# Patient Record
Sex: Female | Born: 1975 | Race: White | Hispanic: No | Marital: Married | Smoking: Current every day smoker
Health system: Southern US, Community
[De-identification: ages and names within clinical notes are randomized; demographics above are authoritative.]

## PROBLEM LIST (undated history)

## (undated) DIAGNOSIS — I10 Essential (primary) hypertension: Secondary | ICD-10-CM

## (undated) DIAGNOSIS — K6282 Dysplasia of anus: Secondary | ICD-10-CM

## (undated) DIAGNOSIS — R011 Cardiac murmur, unspecified: Secondary | ICD-10-CM

## (undated) DIAGNOSIS — E785 Hyperlipidemia, unspecified: Secondary | ICD-10-CM

## (undated) DIAGNOSIS — K219 Gastro-esophageal reflux disease without esophagitis: Secondary | ICD-10-CM

## (undated) DIAGNOSIS — G43909 Migraine, unspecified, not intractable, without status migrainosus: Secondary | ICD-10-CM

## (undated) HISTORY — DX: Hyperlipidemia, unspecified: E78.5

## (undated) HISTORY — DX: Gastro-esophageal reflux disease without esophagitis: K21.9

## (undated) HISTORY — DX: Cardiac murmur, unspecified: R01.1

## (undated) HISTORY — DX: Migraine, unspecified, not intractable, without status migrainosus: G43.909

## (undated) HISTORY — DX: Essential (primary) hypertension: I10

---

## 1997-07-21 ENCOUNTER — Other Ambulatory Visit: Admission: RE | Admit: 1997-07-21 | Discharge: 1997-07-21 | Payer: Self-pay | Admitting: Obstetrics and Gynecology

## 1998-03-24 ENCOUNTER — Other Ambulatory Visit: Admission: RE | Admit: 1998-03-24 | Discharge: 1998-03-24 | Payer: Self-pay | Admitting: Obstetrics and Gynecology

## 1999-02-09 ENCOUNTER — Other Ambulatory Visit: Admission: RE | Admit: 1999-02-09 | Discharge: 1999-02-09 | Payer: Self-pay | Admitting: Obstetrics and Gynecology

## 1999-06-28 ENCOUNTER — Inpatient Hospital Stay (HOSPITAL_COMMUNITY): Admission: AD | Admit: 1999-06-28 | Discharge: 1999-06-28 | Payer: Self-pay | Admitting: *Deleted

## 1999-07-30 ENCOUNTER — Inpatient Hospital Stay (HOSPITAL_COMMUNITY): Admission: AD | Admit: 1999-07-30 | Discharge: 1999-07-30 | Payer: Self-pay | Admitting: Obstetrics and Gynecology

## 1999-08-04 ENCOUNTER — Other Ambulatory Visit: Admission: RE | Admit: 1999-08-04 | Discharge: 1999-08-04 | Payer: Self-pay | Admitting: Obstetrics and Gynecology

## 1999-08-12 ENCOUNTER — Inpatient Hospital Stay (HOSPITAL_COMMUNITY): Admission: AD | Admit: 1999-08-12 | Discharge: 1999-08-15 | Payer: Self-pay | Admitting: Obstetrics and Gynecology

## 1999-08-12 ENCOUNTER — Encounter (INDEPENDENT_AMBULATORY_CARE_PROVIDER_SITE_OTHER): Payer: Self-pay | Admitting: Specialist

## 1999-08-16 ENCOUNTER — Encounter: Admission: RE | Admit: 1999-08-16 | Discharge: 1999-11-14 | Payer: Self-pay | Admitting: *Deleted

## 1999-08-18 ENCOUNTER — Inpatient Hospital Stay (HOSPITAL_COMMUNITY): Admission: AD | Admit: 1999-08-18 | Discharge: 1999-08-20 | Payer: Self-pay | Admitting: Obstetrics and Gynecology

## 1999-09-27 ENCOUNTER — Other Ambulatory Visit: Admission: RE | Admit: 1999-09-27 | Discharge: 1999-09-27 | Payer: Self-pay | Admitting: Obstetrics and Gynecology

## 2000-11-15 ENCOUNTER — Other Ambulatory Visit: Admission: RE | Admit: 2000-11-15 | Discharge: 2000-11-15 | Payer: Self-pay | Admitting: Obstetrics and Gynecology

## 2001-12-25 ENCOUNTER — Other Ambulatory Visit: Admission: RE | Admit: 2001-12-25 | Discharge: 2001-12-25 | Payer: Self-pay | Admitting: Obstetrics and Gynecology

## 2002-10-22 ENCOUNTER — Encounter: Payer: Self-pay | Admitting: Obstetrics and Gynecology

## 2002-10-22 ENCOUNTER — Inpatient Hospital Stay (HOSPITAL_COMMUNITY): Admission: RE | Admit: 2002-10-22 | Discharge: 2002-10-22 | Payer: Self-pay | Admitting: Obstetrics and Gynecology

## 2002-11-19 ENCOUNTER — Other Ambulatory Visit: Admission: RE | Admit: 2002-11-19 | Discharge: 2002-11-19 | Payer: Self-pay | Admitting: Obstetrics and Gynecology

## 2002-11-20 ENCOUNTER — Other Ambulatory Visit: Admission: RE | Admit: 2002-11-20 | Discharge: 2002-11-20 | Payer: Self-pay | Admitting: Obstetrics and Gynecology

## 2003-03-22 HISTORY — PX: CHOLECYSTECTOMY: SHX55

## 2003-04-22 ENCOUNTER — Inpatient Hospital Stay (HOSPITAL_COMMUNITY): Admission: AD | Admit: 2003-04-22 | Discharge: 2003-04-22 | Payer: Self-pay | Admitting: *Deleted

## 2003-06-03 ENCOUNTER — Inpatient Hospital Stay (HOSPITAL_COMMUNITY): Admission: AD | Admit: 2003-06-03 | Discharge: 2003-06-05 | Payer: Self-pay | Admitting: Obstetrics & Gynecology

## 2003-06-09 ENCOUNTER — Inpatient Hospital Stay (HOSPITAL_COMMUNITY): Admission: AD | Admit: 2003-06-09 | Discharge: 2003-06-11 | Payer: Self-pay | Admitting: Obstetrics and Gynecology

## 2003-06-15 ENCOUNTER — Inpatient Hospital Stay (HOSPITAL_COMMUNITY): Admission: AD | Admit: 2003-06-15 | Discharge: 2003-06-15 | Payer: Self-pay | Admitting: Obstetrics and Gynecology

## 2003-07-16 ENCOUNTER — Other Ambulatory Visit: Admission: RE | Admit: 2003-07-16 | Discharge: 2003-07-16 | Payer: Self-pay | Admitting: Obstetrics and Gynecology

## 2004-02-27 ENCOUNTER — Ambulatory Visit (HOSPITAL_COMMUNITY): Admission: RE | Admit: 2004-02-27 | Discharge: 2004-02-27 | Payer: Self-pay | Admitting: Obstetrics and Gynecology

## 2004-02-27 ENCOUNTER — Encounter (INDEPENDENT_AMBULATORY_CARE_PROVIDER_SITE_OTHER): Payer: Self-pay | Admitting: Specialist

## 2004-02-27 HISTORY — PX: TUBAL LIGATION: SHX77

## 2004-07-28 ENCOUNTER — Other Ambulatory Visit: Admission: RE | Admit: 2004-07-28 | Discharge: 2004-07-28 | Payer: Self-pay | Admitting: Obstetrics and Gynecology

## 2008-08-20 HISTORY — PX: LAPAROSCOPIC ASSISTED VAGINAL HYSTERECTOMY: SHX5398

## 2008-08-21 ENCOUNTER — Encounter (INDEPENDENT_AMBULATORY_CARE_PROVIDER_SITE_OTHER): Payer: Self-pay | Admitting: Obstetrics and Gynecology

## 2008-08-21 ENCOUNTER — Ambulatory Visit (HOSPITAL_BASED_OUTPATIENT_CLINIC_OR_DEPARTMENT_OTHER): Admission: RE | Admit: 2008-08-21 | Discharge: 2008-08-21 | Payer: Self-pay | Admitting: Urology

## 2010-06-28 LAB — BASIC METABOLIC PANEL
Calcium: 9.2 mg/dL (ref 8.4–10.5)
Chloride: 105 mEq/L (ref 96–112)
GFR calc Af Amer: >60 mL/min (ref >60)
Glucose, Bld: 96 mg/dL (ref 70–99)
Sodium: 140 mEq/L (ref 135–145)

## 2010-06-28 LAB — CBC
HCT: 43.2 % (ref 36.0–46.0)
Hemoglobin: 14.8 g/dL (ref 12.0–15.0)
MCHC: 34.2 g/dL (ref 30.0–36.0)
MCV: 90.8 fL (ref 78.0–100.0)
RBC: 4.75 MIL/uL (ref 3.87–5.11)
RDW: 12.9 % (ref 11.5–15.5)
WBC: 5.7 10*3/uL (ref 4.0–10.5)

## 2010-08-03 NOTE — Op Note (Signed)
Tina Freeman, Tina Freeman                 ACCOUNT NO.:  [id number]   MEDICAL RECORD NO.:  [id number]          PATIENT TYPE:  AMB   LOCATION:  [facility]                         FACILITY:  [facility]   PHYSICIAN:  Sigmund I. Patsi Sears, M.D.DATE OF BIRTH:  06/13/1975   DATE OF PROCEDURE:  08/21/2008  DATE OF DISCHARGE:                               OPERATIVE REPORT   PREOPERATIVE DIAGNOSIS:  Urinary stress incontinence.   POSTOPERATIVE DIAGNOSIS:  Urinary stress incontinence.   OPERATION:  [employer] retropubic pubovaginal sling.   SURGEON:  Sigmund I. Patsi Sears, M.D.   ANESTHESIA:  General endotracheal.   PREPARATION:  After appropriate preanesthesia, the patient is brought to  the operating room, placed on the operating room in the supine position  where general endotracheal anesthesia was introduced.  She then was  replaced in position for laparoscopic salpingo-oophorectomy per Marcelino Duster  L. Vincente Poli, M.D.  This procedure is dictated separately.   BRIEF HISTORY:  This 35 year old female was noted to be para 2-2-0, with  urinary incontinence beginning 6 months ago.  She complains of cough,  laugh, sneeze, and jumping leakage.  No urge incontinence.  She is post  hysterectomy 2008 for heavy bleeding.  She weighs 223 pounds, is  frustrated because she is unable to exercise.  After consideration of  all options for her, we have selected the [place] retropubic sling, because  of her athletic activity and young age.   PROCEDURE:  Vaginal inspection reveals a small cystocele, no enterocele  or rectocele was noted.  The Foley catheter was placed within the  bladder.  10 mL of Marcaine 0.25% with epinephrine 1:200,000 was  injected in the periurethral space.  A 2 cm midline urethral incision  was made.  Subcutaneous tissue dissected on either side of the urethra  to the level of the retropubic fascia.  Two incisions were then made  above the pubic tubercle, and the [place] needles were placed.  It  is felt  because of the patient's large amount of prepubic fat, that the original  [place] passage was not in the right position.  Therefore the [place] needles  were removed, and replaced to be sure there behind the pubic bone.  After proper placement of the [place] needles, cystoscopy accomplished,  which showed no evidence of any bladder injury or urethral injury.  Following this, the [place] was placed on the needles, and the needles are  brought into the retropubic space, with right angle clamp in the midline  to prevent overtightening the sling.  The blue plastic protector was cut  at the midline, and removed.  With the right-angle present in the  midline, each sleeve was brought into the retropubic space.  The sleeves  were then cut subcutaneously.  There was mild to moderate bleeding prior  to placement of the sling, but no bleeding after the sling was placed.  The wound was irrigated with  saline solution.  The wound was closed with running 2-0 Vicryl suture.  The suprapubic wounds were closed with Dermabond.  The patient was  awakened and taken to recovery room in good  condition.  She had a B and  O suppository given.      Sigmund I. Patsi Sears, M.D.  Electronically Signed     SIT/MEDQ  D:  08/21/2008  T:  08/21/2008  Job:  [id number]   cc:   Marcelino Duster L. Vincente Poli, M.D.  Fax: [phone]   [facility], [place]

## 2010-08-03 NOTE — Op Note (Signed)
NAMESHANTERICA, Tina Freeman                 ACCOUNT NO.:  [id number]   MEDICAL RECORD NO.:  [id number]          PATIENT TYPE:  AMB   LOCATION:  [facility]                         FACILITY:  [facility]   PHYSICIAN:  Michelle L. Grewal, M.D.DATE OF BIRTH:  12/08/1975   DATE OF PROCEDURE:  08/20/2008  DATE OF DISCHARGE:                               OPERATIVE REPORT   PREOP DIAGNOSIS:  Pelvic pain and ovarian cysts.   POSTOP DIAGNOSIS:  Pelvic pain and ovarian cysts.   PROCEDURE:  Laparoscopic bilateral salpingo-oophorectomy and lysis of  adhesions.   SURGEON:  Dr. Vincente Poli.   ANESTHESIA:  General.   EBL:  Minimal.   PATHOLOGY:  Bilateral tubes and ovaries.   PROCEDURE:  Patient was taken to the operating room after informed  consent was obtained.  She was intubated in standard fashion.  She was  then prepped and draped and an in-and-out catheter was used to empty the  bladder.  A speculum was inserted into the vagina and the sponge stick  was inserted into the vagina.  Attention was turned to the abdomen.  A  small infraumbilical incision was made with the Veress needle.  Pneumoperitoneum was performed.  The 11-mm trocar was inserted.  The  laparoscope was introduced through the trocar sheath.  A 5-mm trocar was  inserted suprapubically under direct visualization and a third trocar  site was placed in the left lower quadrant using a 5-mm trocar that was  also inserted under direct visualization.  Exam of the pelvis revealed  the following:  A surgically absent uterus.  Her ovaries were inspected.  Her left ovary was adherent to the sidewall slightly and was enlarged  and had a hemorrhagic cyst within it.  The right ovary was unremarkable.  The grasper was used to elevate initially the right ovary.  I then  identified the ureter in its normal location and noted peristalsis on  the right side.  The EnSeal instrument was used and it was placed across  the IP ligament staying away from the ureter and  cautery was used to  transect and burn the IP ligament and it was carried down across the  mesosalpinx.  The entire ovary and tube was removed.  This was done in  identical fashion on the left side as well.  We did have to take some of  the adhesions down which was done with sharp and blunt dissection.  Careful attention was paid to avoid the ureter which was well away from  our surgical instruments.  After we released both tubes and ovaries, the  suprapubic trocar was changed to an 11-mm trocar.  An Endobag was  inserted and I scooped the ovaries and tubes into the Endobag and  removed the specimen.  Everything was sent to pathology.  The  pneumoperitoneum was released.  The trocars  were removed.  The larger trocar sites were closed with Vicryl  interrupted and the skin was closed with 3-0 Vicryl.  All sponge, lap,  and instrument counts were correct x2.  At this point, I scrubbed out.  Dr. Patsi Sears will scrub  in in that portion.  The remainder of the  surgery will be dictated by him.      Michelle L. Vincente Poli, M.D.  Electronically Signed     MLG/MEDQ  D:  08/22/2008  T:  08/22/2008  Job:  [id number]

## 2010-08-06 NOTE — H&P (Signed)
NAMEAMELIANNA, Tina Freeman                 ACCOUNT NO.:  [id number]   MEDICAL RECORD NO.:  [id number]          PATIENT TYPE:  AMB   LOCATION:  [facility]                           FACILITY:  [facility]   PHYSICIAN:  Guy Sandifer. Tomblin II, M.D.DATE OF BIRTH:  02-20-1976   DATE OF ADMISSION:  DATE OF DISCHARGE:                                HISTORY & PHYSICAL   DATE OF ADMISSION:  February 27, 2004   CHIEF COMPLAINT:  Desires permanent sterilization.   HISTORY OF PRESENT ILLNESS:  This patient is a 35 year old married white  female G2 P2 who desires permanent sterilization.  Alternate methods have  contraception have been discussed.  Permanence of the procedure as well as  potential risks and complications have been discussed preoperatively.   PAST MEDICAL HISTORY:  1.  Chronic hypertension.  2.  History of cystitis.  3.  Recent diagnosis of pneumonia treated as an outpatient with Biaxin, now      resolved.   PAST SURGICAL HISTORY:  1.  Cholecystectomy.  2.  LEEP.  3.  Colposcopy.   OBSTETRICAL HISTORY:  Vaginal delivery x2.   SOCIAL HISTORY:  Denies tobacco, alcohol, or drug abuse.   MEDICATIONS:  Toprol-XL daily.  Biaxin, Duricef, and prednisone started on  February 20, 2004.   ALLERGIES:  SULFA leading to rash followed by shortness of breath.   FAMILY HISTORY:  Coronary artery disease in paternal grandfather and  maternal grandfather.  Varicosities - father and mother, maternal  grandfather, maternal grandmother, paternal grandmother.  Kidney stones -  maternal grandfather and father.  Stroke - maternal grandmother.  Leukemia -  paternal grandmother.  Prostate cancer - maternal great-uncles x2.   REVIEW OF SYSTEMS:  NEURO:  Denies headache.  CARDIO:  Denies chest pain.  PULMONARY:  Denies shortness of breath.  Recent pneumonia as above.  GI:  Denies recent changes in bowel habits.   PHYSICAL EXAMINATION:  VITAL SIGNS:  Height 5 feet 7.5 inches, weight 191  pounds.  Blood pressure 132/82.  HEENT:  Without thyromegaly.  LUNGS:  Clear to auscultation.  HEART:  Regular rate and rhythm.  BACK:  Without CVA tenderness.  BREASTS:  Without mass, retraction, discharge.  ABDOMEN:  Soft, nontender, without masses.  PELVIC:  Vulva, vagina, and cervix without lesion.  Uterus normal size,  mobile, nontender.  Adnexa nontender without masses.  EXTREMITIES AND NEUROLOGIC:  Grossly within normal limits.   ASSESSMENT:  Multiparity, desires permanent sterilization.   PLAN:  Laparoscopy with Filshie clips.      JET/MEDQ  D:  02/23/2004  T:  02/23/2004  Job:  [id number]

## 2010-08-06 NOTE — Discharge Summary (Signed)
Tina Freeman, Tina Freeman                             ACCOUNT NO.:  [id number]   MEDICAL RECORD NO.:  [id number]                   PATIENT TYPE:  INP   LOCATION:  9374                                 FACILITY:  [facility]   PHYSICIAN:  Michelle L. Vincente Poli, M.D.            DATE OF BIRTH:  [birth year]   DATE OF ADMISSION:  06/09/2003  DATE OF DISCHARGE:  06/11/2003                                 DISCHARGE SUMMARY   ADMISSION DIAGNOSIS:  Postpartum hypertension.   DISCHARGE DIAGNOSIS:  Postpartum hypertension.   HOSPITAL COURSE:  The patient is a 35 year old, gravida 2, para 2, status  post SVD on 3/15 who presented with complaint of epigastric pain.  She has a  history of PIH with her last pregnancy.  The patient was admitted after  noticing hypertensive and, by laboratory data, she had elevated LFTs . She  was given magnesium, placed under observation in the AICU.  The patient did  very well by June 11, 2003.  Her blood pressure had declined, her platelet  count was starting to increase to 102, and her hematocrit was 34.6.  Her  LFTs were within normal limits on the day of discharge.  She was discharged  on the afternoon of June 11, 2003.  She was advised to go home on modified  bed rest to follow up in the office later this week for a blood pressure  check.  She was advised to call immediately if developed a headache or  increase in swelling, a little blurry vision or epigastric pain, and advised  no driving until released by the doctor in the office.                                               Michelle L. Vincente Poli, M.D.    Tina Freeman  D:  07/23/2003  T:  07/24/2003  Job:  [id number]

## 2010-08-06 NOTE — Op Note (Signed)
Tina Freeman, Tina Freeman                 ACCOUNT NO.:  [id number]   MEDICAL RECORD NO.:  [id number]          PATIENT TYPE:  AMB   LOCATION:  [facility]                           FACILITY:  [facility]   PHYSICIAN:  Guy Sandifer. Tomblin II, M.D.DATE OF BIRTH:  Jul 22, 1975   DATE OF PROCEDURE:  02/27/2004  DATE OF DISCHARGE:                                 OPERATIVE REPORT   PREOPERATIVE DIAGNOSIS:  Desires permanent sterilization.   POSTOPERATIVE DIAGNOSIS:  1.  Desires permanent sterilization.  2.  Bilateral ovarian cysts.   PROCEDURE:  Open laparoscopy with bilateral Filshie clip application and  bilateral aspiration of ovarian cysts.   SURGEON:  Guy Sandifer. Henderson Cloud, M.D.   ANESTHESIA:  General with endotracheal.   ESTIMATED BLOOD LOSS:  Drops.   SPECIMENS:  Aspirate of bilateral ovarian cysts for cytology.   INDICATIONS FOR PROCEDURE:  This patient is a 35 year old married white  female, gravida 2, para 2, who desires permanent sterilization.  Details  have been dictated in the history and physical.  Laparoscopy with Filshie  clip application has been discussed.  Potential risks and complications are  discussed preoperatively including, but not limited to infection, bowel,  bladder, and ureteral damage, bleeding requiring transfusion of blood  products with possible transfusion reaction, HIV, and hepatitis acquisition,  DVT, PE, and pneumonia, permanence of the procedure, failure rate, and  increased ectopic risks.  All questions have been answered and consent is  signed and on the chart.   FINDINGS:  Upper abdomen is grossly normal.  There are some adhesions of the  omentum superior to the umbilicus to the right of the midline.  In the  pelvis, the uterus is upper limits of normal size.  Anterior and posterior  cul-de-sacs are normal.  Ovaries contain 2 to 3 cm, smooth, translucent  cysts bilaterally.  Tubes are normal bilaterally.   DESCRIPTION OF PROCEDURE:  The patient is taken to the operating  room where  she is identified, placed in the dorsal supine position, and general  anesthesia is induced via endotracheal intubation.  She was then placed in  the dorsal lithotomy position and prepped abdominally and vaginally.  Bladder is straight catheterized.  Hulka tenaculum is placed in the uterus  as a manipulator and she is draped in a sterile fashion.  The patient has a  previous subumbilical vertical scar from a laparoscopic cholecystectomy.  The skin is entered through this same area and dissection is carried out in  layers to the fascia.  The fascia is sharply entered and the anterior fascia  is anchored bilaterally with 0 Vicryl sutures.  The posterior sheath is then  entered without difficulty and the peritoneal cavity is entered without  difficulty.  The disposable open laparoscopic trocar sleeve is then placed  and anchored down with the sutures from above.  The pneumoperitoneum is  induced and the above findings are noted.  A small suprapubic incision is  made and a 5 mm nondisposable trocar sleeve is placed under direct  visualization without difficulty.  Above findings are noted.  The cysts  bilaterally are  aspirated for a small amount of straw-colored fluid.  A  small amount of bleeding from the ovarian capsule at the puncture site is  controlled with bipolar cautery bilaterally.  Approximately 5 mL of 0.5%  plain Marcaine is dripped onto the fallopian tubes bilaterally.  The  fallopian tubes were then identified from cornua to fimbria and a Filshie  clip is placed on the proximal 1/3 of the tube bilaterally.  Filshie clip  applicator is removed and inspection reveals the entire width of the  fallopian tube to be within the clip and the heel of the clip to be visible  through the mesosalpinx bilaterally.  Pneumoperitoneum is reduced and good  hemostasis is noted all around.  Suprapubic trocar sleeve is removed and  good hemostasis is noted there as well.  The umbilical  trocar sleeve is  removed and pneumoperitoneum is reduced.  Anchoring sutures are tied in the  midline.  3-0 Vicryl is then used to reapproximate the subcutaneous tissue  and then to close the skin in an interrupted fashion as well as a single  stitch in the suprapubic incision as well.  0.5% plain Marcaine is injected  into both incisions.  Dermabond is placed on both incisions.  Hulka  tenaculum is removed.  Good hemostasis is noted.  All counts are correct and  the patient is awakened and taken to the recovery room in stable condition.     Jame   JET/MEDQ  D:  02/27/2004  T:  02/27/2004  Job:  [id number]

## 2010-08-06 NOTE — Discharge Summary (Signed)
[facility]  Patient:    Tina Freeman, Tina Freeman                          MRN: [id number] Adm. Date:  [id number] Disc. Date: [id number] Attending:  Madelyn Flavors Dictator:   Danie Chandler, R.N.                           Discharge Summary  ADMISSION DIAGNOSES:          1. Day #5 postpartum.                               2. Postpartum preeclampsia.  DISCHARGE DIAGNOSES:          1. Postpartum preeclampsia, improved.  REASON FOR ADMISSION:         The patient is a 35 year old gravida 1, para 1, who delivered five days ago.  She called the office on Aug 18, 1999, complaining of  chest pain and she was urged to come in for evaluation.  In the office, her blood pressure was 170/104.  She was sent to the maternity admissions unit for evaluation.  In maternity admissions unit her blood pressures were found to be elevated.  They were 146 to 161 over 94 to 96.  The patient was found to have significant right upper quadrant pain and elevated liver function tests.  She was admitted for postpartum preeclampsia.  HOSPITAL COURSE:              The patient did receive magnesium sulfate.  Her labs on admission were as follows:  hemoglobin 12.9, platelets 125,000, creatinine 0.7, uric acid 5.6, SGOT 42, and SGPT 60.  On hospital day #1, the patient continued on magnesium sulfate.  Her blood pressures were 125 to 140 over 70 to 80.  Her deep tendon reflexes were 2+ with no clonus.  She had good urine output.  Her SGOT was 30, SGPT 49, and platelets were 125,000.  The patient did have some hypokalemia and she did receive potassium in her IV fluids as well as p.o. potassium for this. On hospital day #2, the patients breech presentation was 110/64, her O2 saturations were good, her urine output was good.  Her lungs were clear.  Fundus was firm and abdomen soft.  Her magnesium sulfate was discontinued on this day and she was transferred to a regular room.  Her  platelets on this day was 135,000, SGPT 45, and potassium 3.1.  She was discharged home the following day with no complaints. er blood pressure was 130/70.  She was stable.  CONDITION ON DISCHARGE:       Good.  FOLLOW-UP:                    She is to follow up in the office on Monday for lab work and she is to call with increased headache, increased bleeding, shortness f breath, or increased pain. DD:  09/28/99 TD:  09/28/99 Job: [id number]

## 2011-07-18 ENCOUNTER — Encounter (INDEPENDENT_AMBULATORY_CARE_PROVIDER_SITE_OTHER): Payer: Self-pay | Admitting: General Surgery

## 2011-08-03 ENCOUNTER — Ambulatory Visit (INDEPENDENT_AMBULATORY_CARE_PROVIDER_SITE_OTHER): Payer: BC Managed Care – PPO | Admitting: General Surgery

## 2011-08-03 ENCOUNTER — Encounter (INDEPENDENT_AMBULATORY_CARE_PROVIDER_SITE_OTHER): Payer: Self-pay | Admitting: General Surgery

## 2011-08-03 ENCOUNTER — Telehealth (INDEPENDENT_AMBULATORY_CARE_PROVIDER_SITE_OTHER): Payer: Self-pay | Admitting: General Surgery

## 2011-08-03 DIAGNOSIS — K649 Unspecified hemorrhoids: Secondary | ICD-10-CM | POA: Insufficient documentation

## 2011-08-03 NOTE — Telephone Encounter (Signed)
Message copied by Tina Freeman on Wed Aug 03, 2011  2:50 PM ------      Message from: Tina Freeman      Created: Wed Aug 03, 2011 12:16 PM       Ask Dr. Carolynne Edouard which medication he would recommend for her to take for headaches during the "no asp 5 days before sx" time

## 2011-08-03 NOTE — Patient Instructions (Signed)
Plan for exam under anesthesia, possible banding, possible hemorrhoidectomy

## 2011-08-03 NOTE — Telephone Encounter (Signed)
Left voice mail for pt letting her know that she can take ibuprofen if needed for a headache during the 5 days before surgery but that she can not take aspirin.

## 2011-08-03 NOTE — Progress Notes (Signed)
Subjective:     Patient ID: Tina Freeman, female   DOB: Dec 22, 1975, 36 y.o.   MRN: [id number]  HPI We are asked to see the patient in consultation by Dr. Vincente Poli to evaluate her for hemorrhoids. The patient is a 6 her old white female who states that she has been having problems with hemorrhoids for at least the last 8 years since her children were born. Over the last year or so her pain has been worse. She has pain all the time but it is worse with bowel movements. She states that she believes during and after every bowel movement. Her bowel movements over the last few weeks have been loose.  Review of Systems  Constitutional: Negative.   HENT: Negative.   Eyes: Negative.   Respiratory: Negative.   Cardiovascular: Negative.   Gastrointestinal: Positive for anal bleeding and rectal pain.  Genitourinary: Negative.   Musculoskeletal: Negative.   Skin: Negative.   Neurological: Negative.   Hematological: Negative.   Psychiatric/Behavioral: Negative.        Objective:   Physical Exam  Constitutional: She is oriented to person, place, and time. She appears well-developed and well-nourished.  HENT:  Head: Normocephalic and atraumatic.  Eyes: Conjunctivae and EOM are normal. Pupils are equal, round, and reactive to light.  Neck: Normal range of motion. Neck supple.  Cardiovascular: Normal rate, regular rhythm and normal heart sounds.   Pulmonary/Chest: Effort normal and breath sounds normal.  Abdominal: Soft. Bowel sounds are normal. She exhibits no mass. There is no tenderness.  Genitourinary:       Externally she has some mild to moderate size soft external hemorrhoidal skin tags with no sign of acute inflammation or thrombosis. On digital exam she has good rectal tone. On anoscopic exam she appears to have a couple areas posteriorly of an enlarged inflamed internal hemorrhoidal tissue that appears a little friable  Musculoskeletal: Normal range of motion.  Neurological: She is alert and  oriented to person, place, and time.  Skin: Skin is warm and dry.  Psychiatric: She has a normal mood and affect. Her behavior is normal.       Assessment:     The patient has what appears to be bleeding internal hemorrhoidal tissue. Her external disease does not seem bad enough to cause her significant pain.    Plan:     I think she would benefit from an exam under anesthesia and at least an internal hemorrhoid banding. If her hemorrhoids looked bigger externally once we have her to where we can examine her better then she may benefit from a more formal hemorrhoidectomy. I have discussed this with her including the risks and benefits of surgery as well some of the technical aspects and she understands and wishes to proceed.

## 2011-10-07 ENCOUNTER — Other Ambulatory Visit (INDEPENDENT_AMBULATORY_CARE_PROVIDER_SITE_OTHER): Payer: Self-pay | Admitting: General Surgery

## 2011-10-07 DIAGNOSIS — K648 Other hemorrhoids: Secondary | ICD-10-CM

## 2011-10-07 DIAGNOSIS — K644 Residual hemorrhoidal skin tags: Secondary | ICD-10-CM

## 2011-10-07 HISTORY — PX: BAND HEMORRHOIDECTOMY: SHX1213

## 2011-10-11 ENCOUNTER — Ambulatory Visit (INDEPENDENT_AMBULATORY_CARE_PROVIDER_SITE_OTHER): Payer: BC Managed Care – PPO | Admitting: Surgery

## 2011-10-11 ENCOUNTER — Encounter (INDEPENDENT_AMBULATORY_CARE_PROVIDER_SITE_OTHER): Payer: Self-pay | Admitting: Surgery

## 2011-10-11 VITALS — BP 118/78 | HR 74 | Temp 97.8°F | Resp 18 | Ht 66.0 in | Wt 206.0 lb

## 2011-10-11 DIAGNOSIS — K594 Anal spasm: Secondary | ICD-10-CM

## 2011-10-11 MED ORDER — OXYCODONE-ACETAMINOPHEN 7.5-325 MG PO TABS: 1.0000 | ORAL_TABLET | ORAL | Status: DC | PRN

## 2011-10-11 MED ORDER — AMBULATORY NON FORMULARY MEDICATION: 1.0000 "application " | Freq: Four times a day (QID) | Status: DC

## 2011-10-11 NOTE — Progress Notes (Signed)
Tina Freeman 36 y.o.  Body mass index is 33.25 kg/(m^2).  Patient Active Problem List  Diagnosis  . Hemorrhoids    Allergies  Allergen Reactions  . Sulfa Drugs Cross Reactors     Past Surgical History  Procedure Date  . Cholecystectomy 2005  . Tubal ligation 2005  . Abdominal hysterectomy 2007  . Oophorectomy 2009  . Urethral sling 2010   ROBBINS,ROBERT A, MD 1. Anal sphincter spasm     Urgent office visit for Tina Freeman who underwent a hemorrhoidectomy by Dr. Carolynne Edouard last week. She comes urgent office because of extreme pain. She is taking hydrocodone which doesn't phase her pain and some dibucaine and alignment.  On exam she has the usual edema and swollen thrombotic type skin tags. Digital exam reveals marked anal spasm and I tried to talk her about trying to get her sphincter to relax. Instructed her on the use of diltiazem cream and I will give her a prescription for Percocet for pain. She will followup with Dr. Carolynne Edouard heparin yearly schedule appointment. Matt B. Daphine Deutscher, MD, [facility] [phone] beeper [phone]  10/11/2011 4:15 PM

## 2011-10-11 NOTE — Patient Instructions (Signed)
Continue epsom salt tub baths for pain relief Try to apply Diltiezem for anal spasm relief

## 2011-10-24 ENCOUNTER — Ambulatory Visit (INDEPENDENT_AMBULATORY_CARE_PROVIDER_SITE_OTHER): Payer: BC Managed Care – PPO | Admitting: General Surgery

## 2011-10-24 ENCOUNTER — Encounter (INDEPENDENT_AMBULATORY_CARE_PROVIDER_SITE_OTHER): Payer: Self-pay | Admitting: General Surgery

## 2011-10-24 VITALS — BP 128/82 | HR 72 | Temp 97.9°F | Resp 16 | Ht 66.0 in | Wt 211.1 lb

## 2011-10-24 DIAGNOSIS — K6282 Dysplasia of anus: Secondary | ICD-10-CM

## 2011-10-24 DIAGNOSIS — K649 Unspecified hemorrhoids: Secondary | ICD-10-CM

## 2011-10-24 NOTE — Progress Notes (Signed)
Subjective:     Patient ID: Tina Freeman, female   DOB: 08/27/1975, 36 y.o.   MRN: [id number]  HPI The patient is a 36 year old white female who is a couple weeks out from a hemorrhoidectomy. She initially had a lot of pain but this is definitely getting better. She has a little bit a discharge from the area now. Her bowels are moving regularly.  Review of Systems     Objective:   Physical Exam On exam her perirectal area still has some external hemorrhoidal tissue. The area of the hemorrhoidectomy is clean and healing but still has some open and granulated area.    Assessment:     Status post hemorrhoidectomy. 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    Plan:     I believe her perirectal skin needs to be further evaluated for more of this AIN II. We will refer her to Dr. Maisie Fus our colorectal specialist for this

## 2011-11-01 ENCOUNTER — Telehealth (INDEPENDENT_AMBULATORY_CARE_PROVIDER_SITE_OTHER): Payer: Self-pay | Admitting: General Surgery

## 2011-11-01 NOTE — Telephone Encounter (Signed)
LMOM letting pt know that we would have to reschedule her appt w/ Dr. Maisie Fus on 9/3 and that we would contact her to give her a new appt date.

## 2011-11-22 ENCOUNTER — Encounter (INDEPENDENT_AMBULATORY_CARE_PROVIDER_SITE_OTHER): Payer: BC Managed Care – PPO | Admitting: General Surgery

## 2011-11-30 ENCOUNTER — Encounter (INDEPENDENT_AMBULATORY_CARE_PROVIDER_SITE_OTHER): Payer: BC Managed Care – PPO | Admitting: General Surgery

## 2011-12-07 ENCOUNTER — Encounter (INDEPENDENT_AMBULATORY_CARE_PROVIDER_SITE_OTHER): Payer: Self-pay | Admitting: General Surgery

## 2011-12-07 ENCOUNTER — Ambulatory Visit (INDEPENDENT_AMBULATORY_CARE_PROVIDER_SITE_OTHER): Payer: BC Managed Care – PPO | Admitting: General Surgery

## 2011-12-07 VITALS — BP 132/82 | HR 72 | Temp 98.3°F | Resp 18 | Ht 67.0 in | Wt 213.4 lb

## 2011-12-07 DIAGNOSIS — K6282 Dysplasia of anus: Secondary | ICD-10-CM

## 2011-12-07 NOTE — Progress Notes (Signed)
Tina Freeman is a 36 y.o. female who is here for a follow up visit regarding a dysplastic lesion found incidentally after a routing hemorrhoidectomy in mid July.  Patient has a h/o HPV infections and cervical cancer.  She is low risk of having HIV.  She does consent to cigarette smoking.  She denies any immune disorders. Her last Gyn exam was in Nov, and she goes to these apts regularly.  Past Medical History  Diagnosis Date  . Hemorrhoids   . Migraines   . Hypertension   . History of UTI   . GERD (gastroesophageal reflux disease)   . Heart murmur   . Hyperlipidemia    Past Surgical History  Procedure Date  . Cholecystectomy 2005  . Tubal ligation 2005  . Abdominal hysterectomy 2007  . Oophorectomy 2009  . Urethral sling 2010  . Band hemorrhoidectomy 10/07/11    EUA   Current outpatient prescriptions:metoprolol tartrate (LOPRESSOR) 25 MG tablet, Take 25 mg by mouth 2 (two) times daily., Disp: , Rfl: ;  Nutritional Supplements (MENOPAUSE FORMULA PO), Take 2 tablets by mouth 2 (two) times daily. (Profema), Disp: , Rfl: ;  omeprazole (PRILOSEC) 20 MG capsule, Take 20 mg by mouth daily., Disp: , Rfl:   Allergies  Allergen Reactions  . Blue Dyes (Parenteral) Anaphylaxis and Hives  . Sulfa Drugs Cross Reactors    Review of Systems - General ROS: negative for - chills or fever Hematological and Lymphatic ROS: negative for - bleeding problems or bruising Respiratory ROS: no cough, shortness of breath, or wheezing Cardiovascular ROS: no chest pain or dyspnea on exertion Gastrointestinal ROS: no abdominal pain, change in bowel habits, or black or bloody stools  Objective: Filed Vitals:   12/07/11 1613  BP: 132/82  Pulse: 72  Temp: 98.3 F (36.8 C)  Resp: 18    General appearance: alert, cooperative and no distress Perianal:  external skin tags, no condyloma noted Cardiac: regular rate and rhythm Respiratory: clear to auscultation GI: soft, non-tender to palpation  Assessment  and Plan: Tina Freeman is a 36 y.o. female who was found to have a moderately differentiated dysplastic anal lesion.  I would like to perform a high resolution anoscopy to evaluate for any other areas of dysplasia around her anal canal.  We will schedule this for later this year.  The procedure was explained in detail to the patient and all her questions were answered.  I clearly stated that she does not have cancer at this moment and this is a precaution to help determine her risk and reduce this risk of developing cancer going forward.    Vanita Panda, MD [facility], [place] [phone]

## 2011-12-07 NOTE — Patient Instructions (Addendum)
Anal Dysplasia    What is anal dysplasia? Some warts have abnormal changes seen by the pathologist when they look at the removed wart under the microscope. These changes are called anal dysplasia and can be graded as to how advanced their dysplasia or abnormal changes are under the microscope. These changes are referred to by physicians as low-grade and high-grade anal intraepithelial neoplasia (LGAIN/HGAIN). Cells that are becoming malignant or "premalignant", but have not invaded deeper into the skin, are often referred to as HGAIN. While this condition is likely a precursor to anal cancer, this is not anal cancer and is treated differently than anal cancer. Anal dysplasia (LGAIN/HGAIN) is similar to cervical dysplasia (cervical intraepithelial neoplasia or CIN) in that it originates from a HPV infection and can develop into anal and cervical cancer, respectively. Thus, patients with anal dysplasia need close follow up determined by their physician and any new lesions must be evaluated promptly. A gynecologic examination is also recommended in females, as the presence of HGAIN puts a female patient at risk for having CIN. Who is at increased risk for anal dysplasia? Risk factors for anal dysplasia include: 1. Patients with HPV infections (most common) 2. Patients with history of anal intercourse 3. Positive HIV test 4. Cigarette smoking  5. Patients with a weakened immune system from certain medications (solid organ transplantation, RA, IBD) Why do we need to treat anal dysplasia? Once you have anal dysplasia, it rarely disappears. Although still exceedingly uncommon, there is a slight increase risk of anal cancer in patients with a history of anal dysplasia (less than 5%). Its progression in HIV-positive patients seems to be higher. The risk for progression to anal cancer may be as high as 10-50% among HIV-positive patients. How is anal dysplasia diagnosed? Anal dysplasia can be found in anal  warts or sometimes these changes are found incidentally at the time of unrelated anal surgery (i.e. hemorrhoid surgery).  Screening procedures available to detect anal dysplasia include anal cytology and high-resolution anoscopy (HRA). However, they are not universally performed and their role in the management of patients with anal dysplasia is unknown at this time. 1. Anal Papanicolaou (Pap) smear cytology consists of using anal swabs to sample cells from the anal canal and can be used for both screening patients considered high-risk (see list above) and as follow up after anal dysplasia has been treated. Unfortunately, up to 45% of patients can have a false-positive test by anal PAP for anal dysplasia. As well, it is not known if anal PAP improves your outcome or decreases your risk of anal cancer.  2. HRA typically involves the application of temporary stains (3% acetic acid and Lugol's iodine solution) to the anal canal followed by evaluation under high-resolution microscopy to help differentiate normal from abnormal tissue. This is very similar to colposcopy (examination of the cervix) in women who have cervical dysplasia. Directed biopsies are performed for any questionable areas and to identify areas that may need further treatment. How is anal dysplasia treated? 1. Observation alone with close clinical follow-up may be considered for the treatment of anal dysplasia. There has been much debate regarding the best treatment for anal dysplasia. There is a high risk of recurrence following treatments so physicians may recommend close observation and physical examination every 3-6 months depending on your risk factors and if the screening procedures discussed are available in your area. However, due to the high risk of also having cervical dysplasia, a referral to gynecology is recommended. 2. Topical 5%  imiquimod (Aldara) cream or 5% 5-fluorouracil (5-FU) cream may be applied to areas of anal dysplasia.  Local treatment creams may be needed for 9-16 weeks. Up to 90% may have anal lesions disappear, although as many as 50% can recur. Local side effects are very common, occurring in up to 85%, and include skin irritation and hypo-pigmentation (loss of color around the anus), but these stop or reverse after you stop the cream. 3. Photodynamic therapy may be used in select patients. Similar to use in other types of skin cancers, photodynamic therapy has been described in patients with anal dysplasia since 1992. In this process, photosensitizing agents such as 5-aminolevulinic acid creams are applied to the affected area followed by treatment with a specific wavelength laser. Studies have been limited and its role in patients with anal dysplasia is still unknown. 4. Targeted destruction and close clinical long-term follow-up. A variety of surgical techniques to remove anal dysplasia have been used to prevent disease progression. These include wide local excision and targeted therapy using high-resolution anoscopy (HRA). Wide local excision's goal is to remove all of the affected areas with normal surrounding tissue ("negative margins"), although total removal of all disease is often difficult. Sometimes a local flap of normal tissue adjacent to the removed area is used to cover the large defect. There is still a high rate of recurrence of anal dysplasia despite a wide removal of tissue and high rates of complications such as stenosis anal (narrowing of the area) and fecal incontinence. Targeted destruction guided by high-resolution anoscopy is effective to identify, biopsy and destroy anal dysplasia without the long recovery and complications associated with wide local excision. However, there is still a high risk of persistent or recurrent disease, reported in up to 20-80%. Surgical complications such as incontinence and stenosis are generally not seen with HRA, though. Fortunately, both wide local excision and  targeted destruction by HRA have been shown to prevent progression from anal dysplasia to anal cancer. How should I be followed after treatment for anal dysplasia? Patients with anal dysplasia should usually be closely followed long term to prevent or detect recurrence, persistence or progression to anal cancer. Physical examinations may be performed at 3 to 25-month intervals. This approach allows for the treatment of recurrent or persistent dysplasia or the detection of anal cancer. Follow-up generally includes digital rectal examination, anoscopic examination, with or without the aid of magnification or the application of acetic acid and Lugol's solution, and can be performed in an office setting. Anorectal cytology (anal PAP) and/or biopsy may also be included if available in your area. The importance of close follow-up cannot be over emphasized in patients with history of anal dysplasia especially if new lesions develop. What is a colon and rectal surgeon? Colon and rectal surgeons are experts in the surgical and non-surgical treatment of diseases of the colon, rectum and anus. They have completed advanced surgical training in the treatment of these diseases as well as full general surgical training. Board-certified colon and rectal surgeons complete residencies in general surgery and colon and rectal surgery, and pass intensive examinations conducted by the American Board of Surgery and the American Board of Colon and Rectal Surgery. They are well-versed in the treatment of both benign and malignant diseases of the colon, rectum and anus and are able to perform routine screening examinations and surgically treat conditions if indicated to do so. Disclaimer The American Society of Colon and Rectal Surgeons is dedicated to ensuring high-quality patient care by advancing the  science, prevention, and management of disorders and diseases of the colon, rectum, and anus. These brochures are inclusive, and not  prescriptive. Their purpose is to provide information on diseases processes, rather than dictate a specific form of treatment. They are intended for the use of all practitioners, health care workers, and patients who desire information about the management of the conditions addressed by the topics covered in these brochures. It should be recognized that these brochures should not be deemed inclusive of all proper methods of care or exclusive of methods of care reasonably directed to obtaining the same results. The ultimate judgment regarding the propriety of any specific procedure must be made by the physician in light of all the circumstances presented by the individual patient.  author: Sherlean Foot, MD, FASCRS, on behalf of the ASCRS Public Relations Committee   American Society of Colon & Rectal Surgeons

## 2011-12-14 ENCOUNTER — Ambulatory Visit (INDEPENDENT_AMBULATORY_CARE_PROVIDER_SITE_OTHER): Payer: BC Managed Care – PPO | Admitting: General Surgery

## 2012-01-23 ENCOUNTER — Encounter (HOSPITAL_BASED_OUTPATIENT_CLINIC_OR_DEPARTMENT_OTHER): Payer: Self-pay | Admitting: *Deleted

## 2012-01-23 NOTE — Progress Notes (Signed)
NPO AFTER MN. ARRIVES AT 0715. NEEDS HG. WILL TAKE METOPROLOL AND PRILOSEC AM OF SURG W/ SIP OF WATER.

## 2012-01-24 ENCOUNTER — Other Ambulatory Visit (INDEPENDENT_AMBULATORY_CARE_PROVIDER_SITE_OTHER): Payer: Self-pay | Admitting: General Surgery

## 2012-01-24 NOTE — Anesthesia Preprocedure Evaluation (Addendum)
Anesthesia Evaluation  Patient identified by MRN, date of birth, ID band Patient awake    Reviewed: Allergy & Precautions, H&P , NPO status , Patient's Chart, lab work & pertinent test results, reviewed documented beta blocker date and time   Airway Mallampati: II TM Distance: >3 FB Neck ROM: full    Dental No notable dental hx. (+) Teeth Intact and Dental Advisory Given   Pulmonary neg pulmonary ROS,  breath sounds clear to auscultation  Pulmonary exam normal       Cardiovascular Exercise Tolerance: Good hypertension, Pt. on home beta blockers Rhythm:regular Rate:Normal     Neuro/Psych negative neurological ROS  negative psych ROS   GI/Hepatic negative GI ROS, Neg liver ROS, GERD-  Medicated and Controlled,  Endo/Other  negative endocrine ROS  Renal/GU negative Renal ROS  negative genitourinary   Musculoskeletal   Abdominal   Peds  Hematology negative hematology ROS (+)   Anesthesia Other Findings   Reproductive/Obstetrics negative OB ROS                           Anesthesia Physical Anesthesia Plan  ASA: II  Anesthesia Plan: General   Post-op Pain Management:    Induction: Intravenous  Airway Management Planned: LMA  Additional Equipment:   Intra-op Plan:   Post-operative Plan:   Informed Consent: I have reviewed the patients History and Physical, chart, labs and discussed the procedure including the risks, benefits and alternatives for the proposed anesthesia with the patient or authorized representative who has indicated his/her understanding and acceptance.   Dental Advisory Given  Plan Discussed with: CRNA and Surgeon  Anesthesia Plan Comments:         Anesthesia Quick Evaluation

## 2012-01-25 ENCOUNTER — Ambulatory Visit (HOSPITAL_BASED_OUTPATIENT_CLINIC_OR_DEPARTMENT_OTHER)
Admission: RE | Admit: 2012-01-25 | Discharge: 2012-01-25 | Disposition: A | Discharge status: Home / Self Care | Payer: BC Managed Care – PPO | Source: Ambulatory Visit | Attending: General Surgery | Admitting: General Surgery

## 2012-01-25 ENCOUNTER — Encounter (HOSPITAL_BASED_OUTPATIENT_CLINIC_OR_DEPARTMENT_OTHER): Payer: Self-pay

## 2012-01-25 ENCOUNTER — Encounter (HOSPITAL_BASED_OUTPATIENT_CLINIC_OR_DEPARTMENT_OTHER)
Admission: RE | Disposition: A | Discharge status: Home / Self Care | Payer: Self-pay | Source: Ambulatory Visit | Attending: General Surgery

## 2012-01-25 ENCOUNTER — Encounter (HOSPITAL_BASED_OUTPATIENT_CLINIC_OR_DEPARTMENT_OTHER): Payer: Self-pay | Admitting: Anesthesiology

## 2012-01-25 ENCOUNTER — Ambulatory Visit (HOSPITAL_BASED_OUTPATIENT_CLINIC_OR_DEPARTMENT_OTHER): Payer: BC Managed Care – PPO | Admitting: Anesthesiology

## 2012-01-25 DIAGNOSIS — K6282 Dysplasia of anus: Secondary | ICD-10-CM

## 2012-01-25 DIAGNOSIS — E785 Hyperlipidemia, unspecified: Secondary | ICD-10-CM | POA: Insufficient documentation

## 2012-01-25 DIAGNOSIS — I10 Essential (primary) hypertension: Secondary | ICD-10-CM | POA: Insufficient documentation

## 2012-01-25 DIAGNOSIS — D013 Carcinoma in situ of anus and anal canal: Secondary | ICD-10-CM

## 2012-01-25 DIAGNOSIS — K219 Gastro-esophageal reflux disease without esophagitis: Secondary | ICD-10-CM | POA: Insufficient documentation

## 2012-01-25 HISTORY — DX: Dysplasia of anus: K62.82

## 2012-01-25 HISTORY — PX: ANAL FISSURE REPAIR: SHX2312

## 2012-01-25 SURGERY — REPAIR, FISSURE, ANAL
Anesthesia: General | Site: Anus | Wound class: Clean Contaminated

## 2012-01-25 MED ORDER — MIDAZOLAM HCL 5 MG/5ML IJ SOLN
INTRAMUSCULAR | Status: DC | PRN
  Administered 2012-01-25 (×2): 1 mg via INTRAVENOUS

## 2012-01-25 MED ORDER — ONDANSETRON HCL 4 MG/2ML IJ SOLN
4.0000 mg | Freq: Four times a day (QID) | INTRAMUSCULAR | Status: DC | PRN
  Filled 2012-01-25: qty 2

## 2012-01-25 MED ORDER — 0.9 % SODIUM CHLORIDE (POUR BTL) OPTIME
TOPICAL | Status: DC | PRN
  Administered 2012-01-25: 50 mL

## 2012-01-25 MED ORDER — PROPOFOL 10 MG/ML IV BOLUS
INTRAVENOUS | Status: DC | PRN
  Administered 2012-01-25: 230 mg via INTRAVENOUS

## 2012-01-25 MED ORDER — DOCUSATE SODIUM 100 MG PO CAPS: 100.0000 mg | ORAL_CAPSULE | Freq: Two times a day (BID) | ORAL | Status: DC

## 2012-01-25 MED ORDER — ACETAMINOPHEN 650 MG RE SUPP
650.0000 mg | RECTAL | Status: DC | PRN
  Filled 2012-01-25: qty 1

## 2012-01-25 MED ORDER — ONDANSETRON HCL 4 MG/2ML IJ SOLN
INTRAMUSCULAR | Status: DC | PRN
  Administered 2012-01-25: 4 mg via INTRAVENOUS

## 2012-01-25 MED ORDER — LIDOCAINE HCL (CARDIAC) 20 MG/ML IV SOLN
INTRAVENOUS | Status: DC | PRN
  Administered 2012-01-25: 75 mg via INTRAVENOUS

## 2012-01-25 MED ORDER — ACETAMINOPHEN 325 MG PO TABS
650.0000 mg | ORAL_TABLET | ORAL | Status: DC | PRN
  Filled 2012-01-25: qty 2

## 2012-01-25 MED ORDER — SODIUM CHLORIDE 0.9 % IJ SOLN
3.0000 mL | INTRAMUSCULAR | Status: DC | PRN
  Filled 2012-01-25: qty 3

## 2012-01-25 MED ORDER — SODIUM CHLORIDE 0.9 % IV SOLN
250.0000 mL | INTRAVENOUS | Status: DC | PRN
  Filled 2012-01-25: qty 250

## 2012-01-25 MED ORDER — LACTATED RINGERS IV SOLN
INTRAVENOUS | Status: DC
  Filled 2012-01-25: qty 1000

## 2012-01-25 MED ORDER — PSYLLIUM 0.52 G PO CAPS: 0.5200 g | ORAL_CAPSULE | Freq: Two times a day (BID) | ORAL | Status: DC

## 2012-01-25 MED ORDER — OXYCODONE HCL 5 MG PO TABS
5.0000 mg | ORAL_TABLET | ORAL | Status: DC | PRN
  Administered 2012-01-25 (×2): 5 mg via ORAL
  Filled 2012-01-25: qty 2

## 2012-01-25 MED ORDER — BUPIVACAINE-EPINEPHRINE PF 0.5-1:200000 % IJ SOLN
INTRAMUSCULAR | Status: DC | PRN
  Administered 2012-01-25: 10 mL

## 2012-01-25 MED ORDER — FENTANYL CITRATE 0.05 MG/ML IJ SOLN
25.0000 ug | INTRAMUSCULAR | Status: DC | PRN
  Filled 2012-01-25: qty 1

## 2012-01-25 MED ORDER — LACTATED RINGERS IV SOLN
INTRAVENOUS | Status: DC
  Administered 2012-01-25 (×3): via INTRAVENOUS
  Filled 2012-01-25: qty 1000

## 2012-01-25 MED ORDER — SODIUM CHLORIDE 0.9 % IJ SOLN
3.0000 mL | Freq: Two times a day (BID) | INTRAMUSCULAR | Status: DC
  Filled 2012-01-25: qty 3

## 2012-01-25 MED ORDER — FENTANYL CITRATE 0.05 MG/ML IJ SOLN
INTRAMUSCULAR | Status: DC | PRN
  Administered 2012-01-25 (×2): 25 ug via INTRAVENOUS
  Administered 2012-01-25: 50 ug via INTRAVENOUS
  Administered 2012-01-25 (×2): 25 ug via INTRAVENOUS

## 2012-01-25 MED ORDER — ACETIC ACID 5 % SOLN
Status: DC | PRN
  Administered 2012-01-25: 1 via TOPICAL

## 2012-01-25 MED ORDER — HEMOSTATIC AGENTS (NO CHARGE) OPTIME
TOPICAL | Status: DC | PRN
  Administered 2012-01-25: 1 via TOPICAL

## 2012-01-25 MED ORDER — DEXAMETHASONE SODIUM PHOSPHATE 4 MG/ML IJ SOLN
INTRAMUSCULAR | Status: DC | PRN
  Administered 2012-01-25: 10 mg via INTRAVENOUS

## 2012-01-25 MED ORDER — HYDROCODONE-ACETAMINOPHEN 5-325 MG PO TABS: 1.0000 | ORAL_TABLET | ORAL | Status: DC | PRN

## 2012-01-25 SURGICAL SUPPLY — 47 items
BLADE HEX COATED 2.75 (ELECTRODE) IMPLANT
BLADE SURG 15 STRL LF DISP TIS (BLADE) ×1 IMPLANT
BLADE SURG 15 STRL SS (BLADE) ×1
CANISTER SUCTION 2500CC (MISCELLANEOUS) ×2 IMPLANT
CLOTH BEACON ORANGE TIMEOUT ST (SAFETY) ×2 IMPLANT
COVER TABLE BACK 60X90 (DRAPES) ×2 IMPLANT
DECANTER SPIKE VIAL GLASS SM (MISCELLANEOUS) IMPLANT
DRAPE LG THREE QUARTER DISP (DRAPES) ×2 IMPLANT
DRAPE UNDERBUTTOCKS STRL (DRAPE) ×2 IMPLANT
DRSG PAD ABDOMINAL 8X10 ST (GAUZE/BANDAGES/DRESSINGS) ×2 IMPLANT
ELECT REM PT RETURN 9FT ADLT (ELECTROSURGICAL) ×2
ELECTRODE REM PT RTRN 9FT ADLT (ELECTROSURGICAL) ×1 IMPLANT
GAUZE SPONGE 4X4 16PLY XRAY LF (GAUZE/BANDAGES/DRESSINGS) ×4 IMPLANT
GAUZE VASELINE 3X9 (GAUZE/BANDAGES/DRESSINGS) IMPLANT
GLOVE BIO SURGEON STRL SZ 6.5 (GLOVE) ×2 IMPLANT
GLOVE BIOGEL PI IND STRL 7.0 (GLOVE) ×2 IMPLANT
GLOVE BIOGEL PI INDICATOR 7.0 (GLOVE) ×2
GLOVE ECLIPSE 7.0 STRL STRAW (GLOVE) ×2 IMPLANT
GLOVE INDICATOR 7.5 STRL GRN (GLOVE) ×2 IMPLANT
GOWN PREVENTION PLUS LG XLONG (DISPOSABLE) IMPLANT
GOWN PREVENTION PLUS XLARGE (GOWN DISPOSABLE) ×4 IMPLANT
GOWN STRL NON-REIN LRG LVL3 (GOWN DISPOSABLE) ×2 IMPLANT
GOWN STRL REIN XL XLG (GOWN DISPOSABLE) IMPLANT
LEGGING LITHOTOMY PAIR STRL (DRAPES) ×2 IMPLANT
LUBRICANT JELLY K Y 4OZ (MISCELLANEOUS) ×4 IMPLANT
NDL SAFETY ECLIPSE 18X1.5 (NEEDLE) IMPLANT
NEEDLE HYPO 18GX1.5 SHARP (NEEDLE)
NEEDLE HYPO 25X1 1.5 SAFETY (NEEDLE) IMPLANT
NS IRRIG 1000ML POUR BTL (IV SOLUTION) ×2 IMPLANT
PACK BASIN DAY SURGERY FS (CUSTOM PROCEDURE TRAY) ×2 IMPLANT
PACK LITHOTOMY IV (CUSTOM PROCEDURE TRAY) ×2 IMPLANT
PENCIL BUTTON HOLSTER BLD 10FT (ELECTRODE) ×2 IMPLANT
SPONGE GAUZE 4X4 12PLY (GAUZE/BANDAGES/DRESSINGS) IMPLANT
SPONGE SURGIFOAM ABS GEL 12-7 (HEMOSTASIS) IMPLANT
SUT CHROMIC 2 0 SH (SUTURE) IMPLANT
SUT CHROMIC 3 0 SH 27 (SUTURE) ×4 IMPLANT
SUT MON AB 3-0 SH 27 (SUTURE)
SUT MON AB 3-0 SH27 (SUTURE) IMPLANT
SUT VIC AB 4-0 P-3 18XBRD (SUTURE) IMPLANT
SUT VIC AB 4-0 P3 18 (SUTURE)
SYR BULB IRRIGATION 50ML (SYRINGE) IMPLANT
SYR CONTROL 10ML LL (SYRINGE) ×2 IMPLANT
TAPE CLOTH SOFT 2X10 (GAUZE/BANDAGES/DRESSINGS) ×2 IMPLANT
TOWEL OR 17X26 10 PK STRL BLUE (TOWEL DISPOSABLE) ×4 IMPLANT
TRAY DSU PREP LF (CUSTOM PROCEDURE TRAY) ×2 IMPLANT
TUBE CONNECTING 12X1/4 (SUCTIONS) ×2 IMPLANT
YANKAUER SUCT BULB TIP NO VENT (SUCTIONS) ×2 IMPLANT

## 2012-01-25 NOTE — Anesthesia Procedure Notes (Addendum)
Performed by: Fran Lowes    Procedure Name: LMA Insertion Date/Time: 01/25/2012 8:53 AM Performed by: Fran Lowes Pre-anesthesia Checklist: Patient identified, Emergency Drugs available, Suction available and Patient being monitored Patient Re-evaluated:Patient Re-evaluated prior to inductionOxygen Delivery Method: Circle System Utilized Preoxygenation: Pre-oxygenation with 100% oxygen Intubation Type: IV induction Ventilation: Mask ventilation without difficulty LMA: LMA inserted LMA Size: 4.0 Number of attempts: 1 Airway Equipment and Method: bite block Placement Confirmation: positive ETCO2 Tube secured with: Tape Dental Injury: Teeth and Oropharynx as per pre-operative assessment

## 2012-01-25 NOTE — H&P (Signed)
Tina Freeman is a 36 y.o. female who is here regarding a dysplastic lesion found incidentally after a routine hemorrhoidectomy in mid July. Patient has a h/o HPV infections and cervical cancer. She is low risk of having HIV. She does consent to cigarette smoking. She denies any immune disorders. Her last Gyn exam was in Nov, and she goes to these apts regularly.   Past Medical History   Diagnosis  Date   .  Hemorrhoids    .  Migraines    .  Hypertension    .  History of UTI    .  GERD (gastroesophageal reflux disease)    .  Heart murmur    .  Hyperlipidemia     Past Surgical History   Procedure  Date   .  Cholecystectomy  2005   .  Tubal ligation  2005   .  Abdominal hysterectomy  2007   .  Oophorectomy  2009   .  Urethral sling  2010   .  Band hemorrhoidectomy  10/07/11     EUA   Current outpatient prescriptions:metoprolol tartrate (LOPRESSOR) 25 MG tablet, Take 25 mg by mouth 2 (two) times daily., Disp: , Rfl: ; Nutritional Supplements (MENOPAUSE FORMULA PO), Take 2 tablets by mouth 2 (two) times daily. (Profema), Disp: , Rfl: ; omeprazole (PRILOSEC) 20 MG capsule, Take 20 mg by mouth daily., Disp: , Rfl:  Allergies   Allergen  Reactions   .  Blue Dyes (Parenteral)  Anaphylaxis and Hives   .  Sulfa Drugs Cross Reactors    Review of Systems - General ROS: negative for - chills or fever  Hematological and Lymphatic ROS: negative for - bleeding problems or bruising  Respiratory ROS: no cough, shortness of breath, or wheezing  Cardiovascular ROS: no chest pain or dyspnea on exertion  Gastrointestinal ROS: no abdominal pain, change in bowel habits, or black or bloody stools  Objective:  Filed Vitals:   01/25/12 0718  BP: 132/84  Pulse: 75  Temp: 98.2 F (36.8 C)  Resp: 18    General appearance: alert, cooperative and no distress  Perianal: external skin tags, no condyloma noted  Cardiac: regular rate and rhythm  Respiratory: clear to auscultation  GI: soft, non-tender to  palpation   Assessment and Plan:  Tina Freeman is a 36 y.o. female who was found to have a moderately differentiated dysplastic anal lesion on pathology from hemorrhoidectomy. I would like to perform a high resolution anoscopy to evaluate for any other areas of dysplasia around her anal canal. The procedure was explained in detail to the patient and all her questions were answered. I clearly stated that she does not have cancer at this moment and this is a precaution to help determine her risk and reduce this risk of developing cancer going forward.   Vanita Panda, MD  Colorectal and General Surgery [facility]  [phone]

## 2012-01-25 NOTE — Op Note (Signed)
01/25/2012  10:00 AM  PATIENT:  Tina Freeman  36 y.o. female  Patient Care Team: Hadley Pen as PCP - General (Family Medicine)  PRE-OPERATIVE DIAGNOSIS:  anal inaepithelilaneoplasm  POST-OPERATIVE DIAGNOSIS:  History of AIN  PROCEDURE:  Procedure(s): High Resolution Anoscopy with biopsy  SURGEON:  Surgeon(s): Romie Levee, MD Caleen Essex III, MD  ASSISTANT: Carolynne Edouard   ANESTHESIA:   local and general  EBL:  50ml Total I/O In: 1000 [I.V.:1000] Out: -   Delay start of Pharmacological VTE agent (>24hrs) due to surgical blood loss or risk of bleeding:  not applicable  SPECIMEN:  Source of Specimen:  Anal canal- 4 biopsies total  DISPOSITION OF SPECIMEN:  PATHOLOGY  COUNTS:  YES  PLAN OF CARE: Discharge to home after PACU  PATIENT DISPOSITION:  PACU - hemodynamically stable.  Indications: this is a 36 year old female who presented to my clinic with AIN grade 2. This was identified after standard hemorrhoidectomy. It was recommended that she undergo high-resolution endoscopy for further evaluation of any remaining lesions.  Procedure: The patient was identified in the preoperative holding area and taken to the OR where he was laid supine on the operating room table.  Anesthesia was smoothly induced. The patient was placed in lithotomy position.  The patient's perineum was then prepped and draped in the a standard surgical fashion. A surgical timeout was performed indicating the correct patient, procedure, positioning, and preoperative antibiotics.  After this was completed, a sponge was soaked in 2.5% acetic acid and placed over the perianal region. This was allowed to soak for 2 minutes. The sponge was removed and the perianal region was evaluated.  There were no anal margin lesions identified.  The internal anal canal was evaluated via anoscopy with a Hill-Ferguson anoscope.  There were 4 internal lesions identified.  These were biopsied with Metzenbaum scissors and sent to  pathology as #1 right lateral anal Canal lesion, #2 anterior anal canal lesion #3 left anterior anal canal lesion and #4 posterior anal canal lesion.  After this was completed, hemostasis was achieved with electrocautery and all of the biopsy sites were closed using a 3-0 chromic suture.  Hemostasis was achieved using direct pressure. A piece of Gelfoam was placed into the anal canal for added hemostasis.  A four-quadrant rectal block was performed with half percent Marcaine with epinephrine.  A sterile dressing was applied. The patient was then awakened from anesthesia and sent to the postanesthesia care unit in stable condition. All counts were correct per operating room staff. I was present and personally performed the entire procedure.

## 2012-01-25 NOTE — Transfer of Care (Signed)
Immediate Anesthesia Transfer of Care Note  Patient: Tina Freeman  Procedure(s) Performed: Procedure(s) (LRB): ANAL FISSURE REPAIR (N/A)  Patient Location: Patient transported to PACU with oxygen via face mask at 4 Liters / Min  Anesthesia Type: General  Level of Consciousness: awake and alert   Airway & Oxygen Therapy: Patient Spontanous Breathing and Patient connected to face mask oxygen  Post-op Assessment: Report given to PACU RN and Post -op Vital signs reviewed and stable  Post vital signs: Reviewed and stable  Dentition: Teeth and oropharynx remain in pre-op condition  Complications: No apparent anesthesia complications

## 2012-01-25 NOTE — Anesthesia Postprocedure Evaluation (Signed)
  Anesthesia Post-op Note  Patient: Tina Freeman  Procedure(s) Performed: Procedure(s) (LRB): ANAL FISSURE REPAIR (N/A)  Patient Location: PACU  Anesthesia Type: General  Level of Consciousness: awake and alert   Airway and Oxygen Therapy: Patient Spontanous Breathing  Post-op Pain: mild  Post-op Assessment: Post-op Vital signs reviewed, Patient's Cardiovascular Status Stable, Respiratory Function Stable, Patent Airway and No signs of Nausea or vomiting  Post-op Vital Signs: stable  Complications: No apparent anesthesia complications

## 2012-01-26 ENCOUNTER — Encounter (HOSPITAL_BASED_OUTPATIENT_CLINIC_OR_DEPARTMENT_OTHER): Payer: Self-pay | Admitting: General Surgery

## 2012-01-26 ENCOUNTER — Telehealth (INDEPENDENT_AMBULATORY_CARE_PROVIDER_SITE_OTHER): Payer: Self-pay | Admitting: General Surgery

## 2012-01-26 NOTE — Telephone Encounter (Signed)
Pt's husband calling to report wife is in fetal position in bed, crying with pain.  She had surgery on anal fissure yesterday (Dr. Maisie Fus.)  Taking Vicodin for pain, but it is not helping the pain at all.  Passing some blood per rectum.  Paged Dr. Maisie Fus for advice.

## 2012-01-26 NOTE — Telephone Encounter (Signed)
No call returned from Dr. Maisie Fus, so asked Dr. Abbey Chatters (urgent clinic) to address pt's pain needs.  Oxycodone IR 5 mg, # 30, 1-2 po Q 4-6 H prn pain, no refill ordered.  Contacted pt's husband to pick up from front desk.

## 2012-01-28 ENCOUNTER — Emergency Department (HOSPITAL_COMMUNITY): Payer: BC Managed Care – PPO

## 2012-01-28 ENCOUNTER — Emergency Department (HOSPITAL_COMMUNITY)
Admission: EM | Admit: 2012-01-28 | Discharge: 2012-01-28 | Disposition: A | Discharge status: Home / Self Care | Payer: BC Managed Care – PPO | Attending: Emergency Medicine | Admitting: Emergency Medicine

## 2012-01-28 ENCOUNTER — Encounter (HOSPITAL_COMMUNITY): Payer: Self-pay | Admitting: Emergency Medicine

## 2012-01-28 DIAGNOSIS — K625 Hemorrhage of anus and rectum: Secondary | ICD-10-CM | POA: Insufficient documentation

## 2012-01-28 DIAGNOSIS — Z8679 Personal history of other diseases of the circulatory system: Secondary | ICD-10-CM | POA: Insufficient documentation

## 2012-01-28 DIAGNOSIS — I10 Essential (primary) hypertension: Secondary | ICD-10-CM | POA: Insufficient documentation

## 2012-01-28 DIAGNOSIS — Z9889 Other specified postprocedural states: Secondary | ICD-10-CM | POA: Insufficient documentation

## 2012-01-28 DIAGNOSIS — IMO0002 Reserved for concepts with insufficient information to code with codable children: Secondary | ICD-10-CM | POA: Insufficient documentation

## 2012-01-28 DIAGNOSIS — Z79899 Other long term (current) drug therapy: Secondary | ICD-10-CM | POA: Insufficient documentation

## 2012-01-28 DIAGNOSIS — K6289 Other specified diseases of anus and rectum: Secondary | ICD-10-CM | POA: Insufficient documentation

## 2012-01-28 DIAGNOSIS — K219 Gastro-esophageal reflux disease without esophagitis: Secondary | ICD-10-CM | POA: Insufficient documentation

## 2012-01-28 DIAGNOSIS — K921 Melena: Secondary | ICD-10-CM | POA: Insufficient documentation

## 2012-01-28 DIAGNOSIS — Z8669 Personal history of other diseases of the nervous system and sense organs: Secondary | ICD-10-CM | POA: Insufficient documentation

## 2012-01-28 DIAGNOSIS — Y838 Other surgical procedures as the cause of abnormal reaction of the patient, or of later complication, without mention of misadventure at the time of the procedure: Secondary | ICD-10-CM | POA: Insufficient documentation

## 2012-01-28 DIAGNOSIS — E785 Hyperlipidemia, unspecified: Secondary | ICD-10-CM | POA: Insufficient documentation

## 2012-01-28 DIAGNOSIS — K6282 Dysplasia of anus: Secondary | ICD-10-CM | POA: Insufficient documentation

## 2012-01-28 LAB — COMPREHENSIVE METABOLIC PANEL
Albumin: 4 g/dL (ref 3.5–5.2)
Alkaline Phosphatase: 146 U/L — ABNORMAL HIGH (ref 39–117)
BUN: 10 mg/dL (ref 6–23)
CO2: 25 mEq/L (ref 19–32)
Chloride: 101 mEq/L (ref 96–112)
GFR calc non Af Amer: >90 mL/min (ref >90)
Potassium: 4.2 mEq/L (ref 3.5–5.1)
Total Bilirubin: 0.7 mg/dL (ref 0.3–1.2)

## 2012-01-28 LAB — CBC WITH DIFFERENTIAL/PLATELET
Basophils Absolute: 0 10*3/uL (ref 0.0–0.1)
Basophils Relative: 0 % (ref 0–1)
Eosinophils Relative: 1 % (ref 0–5)
HCT: 41.4 % (ref 36.0–46.0)
Hemoglobin: 14.5 g/dL (ref 12.0–15.0)
Lymphocytes Relative: 25 % (ref 12–46)
Monocytes Relative: 7 % (ref 3–12)
Neutro Abs: 4.6 10*3/uL (ref 1.7–7.7)
RBC: 4.71 MIL/uL (ref 3.87–5.11)
RDW: 12.4 % (ref 11.5–15.5)
WBC: 6.9 10*3/uL (ref 4.0–10.5)

## 2012-01-28 LAB — TYPE AND SCREEN: ABO/RH(D): O POS

## 2012-01-28 MED ORDER — HYDROMORPHONE HCL PF 2 MG/ML IJ SOLN
2.0000 mg | Freq: Once | INTRAMUSCULAR | Status: DC
  Filled 2012-01-28: qty 1

## 2012-01-28 MED ORDER — SODIUM CHLORIDE 0.9 % IV BOLUS (SEPSIS)
1000.0000 mL | Freq: Once | INTRAVENOUS | Status: AC
  Administered 2012-01-28: 1000 mL via INTRAVENOUS

## 2012-01-28 MED ORDER — BELLADONNA-OPIUM 16.2-30 MG RE SUPP: 30.0000 mg | Freq: Two times a day (BID) | RECTAL | Status: DC | PRN

## 2012-01-28 MED ORDER — HYDROMORPHONE HCL PF 2 MG/ML IJ SOLN
2.0000 mg | Freq: Once | INTRAMUSCULAR | Status: AC
  Administered 2012-01-28: 2 mg via INTRAVENOUS

## 2012-01-28 MED ORDER — FENTANYL CITRATE 0.05 MG/ML IJ SOLN
50.0000 ug | Freq: Once | INTRAMUSCULAR | Status: AC
  Administered 2012-01-28: 50 ug via INTRAVENOUS
  Filled 2012-01-28: qty 2

## 2012-01-28 NOTE — ED Provider Notes (Deleted)
History     CSN: [id number]  Arrival date & time 01/28/12  1124   First MD Initiated Contact with Patient 01/28/12 1148      Chief Complaint  Patient presents with  . Post-op Problem  . Rectal Bleeding    (Consider location/radiation/quality/duration/timing/severity/associated sxs/prior treatment) HPI Comments: .Tina Freeman 36 y.o. female   The chief complaint is: Patient presents with:   Post-op Problem   Rectal Bleeding   2s/p rectal surgery 3 days ago.  Dr. Maisie Fus at [facility]. Removed 4 lesions.  Previous history of hemorrhoid surgery and abnormal lesions in the rectum.  Had lesions removed on the 6th, pathology pending. Was told that she would have bleeding and clots for the first 48 hours,  However bleeding has gotten worse.  She is passing gas, but she has to sit on the commode because she fills the toiled with blood.  No BMs since surgery. Patient has been having rectal spasms, and 10/10 pain. Has been taking Norco without relief.  Patient was also given percocet yesterday which knocks her out. She iw awakened form sleep by the spasms.      Patient is a 36 y.o. female presenting with hematochezia.  Rectal Bleeding  Associated symptoms include rectal pain. Pertinent negatives include no diarrhea, no nausea and no vomiting.    Past Medical History  Diagnosis Date  . Migraines   . Hypertension   . GERD (gastroesophageal reflux disease)   . Hyperlipidemia   . Heart murmur ASYMPTOMATIC  . Anal intraepithelial neoplasia I     Past Surgical History  Procedure Date  . Oophorectomy 2009  . Urethral sling 2010  . Band hemorrhoidectomy 10/07/11    EUA  . Lynx retropubic pubovaginal sling 08-21-2008  DR Patsi Sears  . Laparoscopic assisted vaginal hysterectomy 08-20-2008  DR  Vincente Poli    W/ BILATERAL SALPINGO-OOPHORECTOMY (DONE AT SAME TIME SLING)  . Tubal ligation 02-27-2004    LAPAROSCOPIC W/ FILSHIE CLIPS  . Cholecystectomy 2005  . Anal fissure repair 01/25/2012   Procedure: ANAL FISSURE REPAIR;  Surgeon: Romie Levee, MD;  Location: [facility];  Service: General;  Laterality: N/A;  high resolution anoscopy with biopsy   colposcope needed  . Abdominal hysterectomy     Family History  Problem Relation Age of Onset  . Cancer Mother     ovarian  . Cancer Father     prostate  . Cancer Sister     ovarian    History  Substance Use Topics  . Smoking status: Current Every Day Smoker -- 0.7 packs/day for 20 years    Types: Cigarettes  . Smokeless tobacco: Never Used  . Alcohol Use: Yes     Comment: rarely    OB History    Grav Para Term Preterm Abortions TAB SAB Ect Mult Living                  Review of Systems  Constitutional: Negative.  Negative for appetite change.  HENT: Negative.   Eyes: Negative.   Respiratory: Negative.   Cardiovascular: Negative.   Gastrointestinal: Positive for blood in stool, hematochezia, anal bleeding and rectal pain. Negative for nausea, vomiting and diarrhea.  Genitourinary: Negative.   Musculoskeletal: Negative.   Skin: Negative.   Neurological: Negative.   All other systems reviewed and are negative.    Allergies  Blue dyes (parenteral) and Sulfa antibiotics  Home Medications   Current Outpatient Rx  Name  Route  Sig  Dispense  Refill  .  VITAMIN D3 2000 UNITS PO TABS   Oral   Take 1 capsule by mouth daily.         Marland Kitchen DOCUSATE SODIUM 100 MG PO CAPS   Oral   Take 100 mg by mouth 2 (two) times daily.         Marland Kitchen HYDROCODONE-ACETAMINOPHEN 5-325 MG PO TABS   Oral   Take 1-2 tablets by mouth every 4 (four) hours as needed. Pain         . METOPROLOL TARTRATE 25 MG PO TABS   Oral   Take 25 mg by mouth 2 (two) times daily.         Marland Kitchen MENOPAUSE FORMULA PO   Oral   Take 2 tablets by mouth 2 (two) times daily. (Profema)         . FISH OIL 1000 MG PO CAPS   Oral   Take 1 capsule by mouth daily.         Marland Kitchen OMEPRAZOLE 20 MG PO CPDR   Oral   Take 20 mg by mouth  every morning.          . PSYLLIUM 0.52 G PO CAPS   Oral   Take 0.52 g by mouth 2 (two) times daily.           BP 127/90  Pulse 79  Temp 99 F (37.2 C) (Oral)  Resp 20  SpO2 100%  Physical Exam Physical Exam  Nursing note and vitals reviewed. Constitutional: She is oriented to person, place, and time. She appears well-developed and well-nourished. No distress.  HENT:  Head: Normocephalic and atraumatic.  Eyes: Conjunctivae normal and EOM are normal. Pupils are equal, round, and reactive to light. No scleral icterus.  Neck: Normal range of motion.  Cardiovascular: Normal rate, regular rhythm and normal heart sounds.  Exam reveals no gallop and no friction rub.   No murmur heard. Pulmonary/Chest: Effort normal and breath sounds normal. No respiratory distress.  Abdominal: Soft. Bowel sounds are normal. She exhibits no distension and no mass. There is no tenderness. There is no guarding.  Neurological: She is alert and oriented to person, place, and time.  Skin: Skin is warm and dry. She is not diaphoretic.     ED Course  Procedures (including critical care time)  Labs Reviewed  CBC WITH DIFFERENTIAL - Abnormal; Notable for the following:    Platelets 105 (*)     All other components within normal limits  COMPREHENSIVE METABOLIC PANEL - Abnormal; Notable for the following:    AST 342 (*)     ALT 578 (*)     Alkaline Phosphatase 146 (*)     All other components within normal limits  TYPE AND SCREEN  ABO/RH   Dg Abd Acute W/chest  01/28/2012  *RADIOLOGY REPORT*  Clinical Data: Rule out perforation, rectal polyps and pain  ACUTE ABDOMEN SERIES (ABDOMEN 2 VIEW & CHEST 1 VIEW)  Comparison: 08/25/2010  Findings: Cardiomediastinal silhouette is stable.  No acute infiltrate or pleural effusion.  No pulmonary edema.  There is nonspecific nonobstructive bowel gas pattern. Post cholecystectomy surgical clips are noted.  Moderate stool noted in the right colon and transverse  colon.  No free abdominal air.  IMPRESSION: No acute disease.  Moderate stool noted in the right and transverse colon.  Post cholecystectomy surgical clips are noted.  Nonspecific nonobstructive bowel gas pattern.  No free abdominal air.   Original Report Authenticated By: Natasha Mead, M.D.      1.  Rectal pain       MDM  Patient without perforation.  She has rectal spasms.  Patient seen in shared visit with Dr. Patria Mane.  Patient witll be given BandO suppositories.  She will f/u with surgery. Discussed reasons to seek immediate care. Patient expresses understanding and agrees with plan.         Arthor Captain, PA-C 01/28/12 2038  Arthor Captain, PA-C 02/07/12 [phone]

## 2012-01-28 NOTE — ED Notes (Signed)
Pt had surgery for removal of four lesions from her rectum. Was instructed if she had rectal bleeding for more then 48 hrs to come to the hospital. Pt had pre-cancerous hemmoroid removed October 06, 2011. Pt is having large amts of BRB per rectum w/ clots.. Has never stopped bleeding since surgery.

## 2012-01-29 NOTE — ED Provider Notes (Signed)
Medical screening examination/treatment/procedure(s) were conducted as a shared visit with non-physician practitioner(s) and myself.  I personally evaluated the patient during the encounter  Pain controlled in ER. External anus without active bleeding. Pt would not allow digital rectal exam. Abdomen benign. Spoke with Dr Gerrit Friends, on call surgery who agrees with close follow up next week in office and will help arrange. Recommends B and O suppositories and warm tub soaks. Pt well appearing. Elevation of LFTs without any RUQ abdominal pain or icterus. Will need these repeated in follow up.   1. Rectal pain    Dg Abd Acute W/chest  01/28/2012  *RADIOLOGY REPORT*  Clinical Data: Rule out perforation, rectal polyps and pain  ACUTE ABDOMEN SERIES (ABDOMEN 2 VIEW & CHEST 1 VIEW)  Comparison: 08/25/2010  Findings: Cardiomediastinal silhouette is stable.  No acute infiltrate or pleural effusion.  No pulmonary edema.  There is nonspecific nonobstructive bowel gas pattern. Post cholecystectomy surgical clips are noted.  Moderate stool noted in the right colon and transverse colon.  No free abdominal air.  IMPRESSION: No acute disease.  Moderate stool noted in the right and transverse colon.  Post cholecystectomy surgical clips are noted.  Nonspecific nonobstructive bowel gas pattern.  No free abdominal air.   Original Report Authenticated By: Natasha Mead, M.D.   I personally reviewed the imaging tests through PACS system I reviewed available ER/hospitalization records through the EMR Results for orders placed during the hospital encounter of 01/28/12  CBC WITH DIFFERENTIAL      Component Value Range   WBC 6.9  4.0 - 10.5 K/uL   RBC 4.71  3.87 - 5.11 MIL/uL   Hemoglobin 14.5  12.0 - 15.0 g/dL   HCT 47.8  29.5 - 62.1 %   MCV 87.9  78.0 - 100.0 fL   MCH 30.8  26.0 - 34.0 pg   MCHC 35.0  30.0 - 36.0 g/dL   RDW 30.8  65.7 - 84.6 %   Platelets 105 (*) 150 - 400 K/uL   Neutrophils Relative 67  43 - 77 %   Lymphocytes Relative 25  12 - 46 %   Monocytes Relative 7  3 - 12 %   Eosinophils Relative 1  0 - 5 %   Basophils Relative 0  0 - 1 %   Neutro Abs 4.6  1.7 - 7.7 K/uL   Lymphs Abs 1.7  0.7 - 4.0 K/uL   Monocytes Absolute 0.5  0.1 - 1.0 K/uL   Eosinophils Absolute 0.1  0.0 - 0.7 K/uL   Basophils Absolute 0.0  0.0 - 0.1 K/uL   WBC Morphology ATYPICAL LYMPHOCYTES    COMPREHENSIVE METABOLIC PANEL      Component Value Range   Sodium 138  135 - 145 mEq/L   Potassium 4.2  3.5 - 5.1 mEq/L   Chloride 101  96 - 112 mEq/L   CO2 25  19 - 32 mEq/L   Glucose, Bld 88  70 - 99 mg/dL   BUN 10  6 - 23 mg/dL   Creatinine, Ser 9.62  0.50 - 1.10 mg/dL   Calcium 9.2  8.4 - 95.2 mg/dL   Total Protein 7.1  6.0 - 8.3 g/dL   Albumin 4.0  3.5 - 5.2 g/dL   AST 841 (*) 0 - 37 U/L   ALT 578 (*) 0 - 35 U/L   Alkaline Phosphatase 146 (*) 39 - 117 U/L   Total Bilirubin 0.7  0.3 - 1.2 mg/dL   GFR calc non  Af Amer >90  >90 mL/min   GFR calc Af Amer >90  >90 mL/min  TYPE AND SCREEN      Component Value Range   ABO/RH(D) O POS     Antibody Screen NEG     Sample Expiration 01/31/2012    ABO/RH      Component Value Range   ABO/RH(D) O POS       Lyanne Co, MD 01/29/12 [phone]

## 2012-01-30 ENCOUNTER — Encounter (INDEPENDENT_AMBULATORY_CARE_PROVIDER_SITE_OTHER): Payer: BC Managed Care – PPO | Admitting: Surgery

## 2012-01-30 ENCOUNTER — Other Ambulatory Visit (INDEPENDENT_AMBULATORY_CARE_PROVIDER_SITE_OTHER): Payer: Self-pay | Admitting: General Surgery

## 2012-01-30 MED ORDER — PHENYLEPHRINE IN HARD FAT 0.25 % RE SUPP: 1.0000 | Freq: Two times a day (BID) | RECTAL | Status: DC

## 2012-01-31 ENCOUNTER — Encounter (INDEPENDENT_AMBULATORY_CARE_PROVIDER_SITE_OTHER): Payer: Self-pay | Admitting: General Surgery

## 2012-01-31 ENCOUNTER — Encounter (HOSPITAL_BASED_OUTPATIENT_CLINIC_OR_DEPARTMENT_OTHER): Payer: Self-pay

## 2012-01-31 ENCOUNTER — Ambulatory Visit (INDEPENDENT_AMBULATORY_CARE_PROVIDER_SITE_OTHER): Payer: BC Managed Care – PPO | Admitting: General Surgery

## 2012-01-31 VITALS — BP 124/80 | HR 76 | Temp 97.2°F | Resp 20 | Ht 66.5 in | Wt 209.2 lb

## 2012-01-31 DIAGNOSIS — K6282 Dysplasia of anus: Secondary | ICD-10-CM

## 2012-01-31 MED ORDER — OXYCODONE HCL 5 MG PO TABS: 5.0000 mg | ORAL_TABLET | ORAL | Status: DC | PRN

## 2012-01-31 NOTE — Patient Instructions (Addendum)
Keep your scheduled apt Cont warm baths.  Start using numbing cream every 4 hours as needed.  Switch to fiber supplement that you dissolve in water.  Cont stool softener.  Drink plenty of water.

## 2012-01-31 NOTE — Progress Notes (Signed)
Tina Freeman is a 36 y.o. female who is status post a HRA on 11/6 with biopsies.  She presented once to the ED with bloody BM's over the weekend.  Her Hgb was 14.5.  She continues to have some bleeding with bowel movements.  Her pain is controlled with narcotics.  Her BM's are getting better and more soft.    Objective: Filed Vitals:   01/31/12 1213  BP: 124/80  Pulse: 76  Temp: 97.2 F (36.2 C)  Resp: 20    General appearance: alert and cooperative Anal: externally, no signs of bleeding.  Unable to perform an internal exam due to pain.  LL skin tag inflamed  Incision: healing well   Assessment: s/p  Patient Active Problem List  Diagnosis  . Hemorrhoids  . Anal intraepithelial neoplasia II (AIN II)    Path Diagnosis 1. Anus, biopsy, right lateral - MODERATE AND SEVERE SQUAMOUS DYSPLASIA, AIN-II AND AIN-III 2. Anus, biopsy, anterior - SEVERE SQUAMOUS DYSPLASIA, AIN-III. 3. Anus, biopsy, left anterior - SEVERE SQUAMOUS DYSPLASIA, AIN-III. 4. Anus, biopsy, posterior - MILD AND MODERATE SQUAMOUS DYSPLASIA, AIN-I AND AIN-II.  Plan:  Continue Sitz baths, stool softeners and fiber.  Avoid constipation.  She will use a topical anesthetic cream that she has at home to help with the pain.  I have recommended that we repeat the HRA in 3 months.  She will see me back on the 19th.   Vanita Panda, MD [facility], [place] [phone]   01/31/2012 1:02 PM

## 2012-02-07 ENCOUNTER — Ambulatory Visit (INDEPENDENT_AMBULATORY_CARE_PROVIDER_SITE_OTHER): Payer: BC Managed Care – PPO | Admitting: General Surgery

## 2012-02-07 ENCOUNTER — Encounter (INDEPENDENT_AMBULATORY_CARE_PROVIDER_SITE_OTHER): Payer: Self-pay | Admitting: General Surgery

## 2012-02-07 VITALS — BP 144/88 | HR 64 | Temp 97.0°F | Resp 20 | Ht 67.0 in | Wt 210.0 lb

## 2012-02-07 DIAGNOSIS — D013 Carcinoma in situ of anus and anal canal: Secondary | ICD-10-CM

## 2012-02-07 MED ORDER — LIDOCAINE 5 % EX OINT: TOPICAL_OINTMENT | CUTANEOUS | Status: DC | PRN

## 2012-02-07 NOTE — Patient Instructions (Signed)
We will schedule your high resolution anoscopy in Feb.  I have called in a prescription for some cream for your bottom.

## 2012-02-07 NOTE — Progress Notes (Signed)
Tina Freeman is a 36 y.o. female who is here for a follow up visit regarding her anal dysplasia.  She is status post a HRA on 11/6 with biopsies. She presented once to the ED with bloody BM's the weekend following.  She continues to have some bleeding and pain with bowel movements but this is getting better. Her pain is controlled with minimal narcotics. Her BM's are soft.   Objective: Filed Vitals:   02/07/12 0940  BP: 144/88  Pulse: 64  Temp: 97 F (36.1 C)  Resp: 20    General appearance: alert, cooperative and no distress Anal Exam: small prolapsed skin tag with open biopsy site  Assessment and Plan: I have given her a refill on her anal lidocaine cream.  She will use this until her pain is gone.  She will schedule a follow up HRA in 3 mo.     Vanita Panda, MD [facility], [place] [phone]

## 2012-02-17 NOTE — ED Provider Notes (Signed)
Arthor Captain, PA-C PHYSICIAN ASSISTANT CERTIFIED Deleted Emergency Medicine ED Provider Notes 01/28/2012 12:07 PM  History      CSN: [id number]   Arrival date & time 01/28/12  1124    First MD Initiated Contact with Patient 01/28/12 1148         Chief Complaint   Patient presents with   .  Post-op Problem   .  Rectal Bleeding      (Consider location/radiation/quality/duration/timing/severity/associated sxs/prior treatment) HPI Comments: .Tina Freeman 36 y.o. female    The chief complaint is: Patient presents with:   Post-op Problem   Rectal Bleeding     2s/p rectal surgery 3 days ago.  Dr. Maisie Fus at [facility]. Removed 4 lesions.  Previous history of hemorrhoid surgery and abnormal lesions in the rectum.  Had lesions removed on the 6th, pathology pending. Was told that she would have bleeding and clots for the first 48 hours,  However bleeding has gotten worse.  She is passing gas, but she has to sit on the commode because she fills the toiled with blood.  No BMs since surgery. Patient has been having rectal spasms, and 10/10 pain. Has been taking Norco without relief.  Patient was also given percocet yesterday which knocks her out. She iw awakened form sleep by the spasms.   Patient is a 36 y.o. female presenting with hematochezia.  Rectal Bleeding   Associated symptoms include rectal pain. Pertinent negatives include no diarrhea, no nausea and no vomiting.     Past Medical History   Diagnosis  Date   .  Migraines     .  Hypertension     .  GERD (gastroesophageal reflux disease)     .  Hyperlipidemia     .  Heart murmur  ASYMPTOMATIC   .  Anal intraepithelial neoplasia I         Past Surgical History   Procedure  Date   .  Oophorectomy  2009   .  Urethral sling  2010   .  Band hemorrhoidectomy  10/07/11       EUA   .  Lynx retropubic pubovaginal sling  08-21-2008  DR Patsi Sears   .  Laparoscopic assisted vaginal hysterectomy  08-20-2008  DR  Vincente Poli       W/  BILATERAL SALPINGO-OOPHORECTOMY (DONE AT SAME TIME SLING)   .  Tubal ligation  02-27-2004       LAPAROSCOPIC W/ FILSHIE CLIPS   .  Cholecystectomy  2005   .  Anal fissure repair  01/25/2012       Procedure: ANAL FISSURE REPAIR;  Surgeon: Romie Levee, MD;  Location: [facility];  Service: General;  Laterality: N/A;  high resolution anoscopy with biopsy    colposcope needed   .  Abdominal hysterectomy         Family History   Problem  Relation  Age of Onset   .  Cancer  Mother         ovarian   .  Cancer  Father         prostate   .  Cancer  Sister         ovarian       History   Substance Use Topics   .  Smoking status:  Current Every Day Smoker -- 0.7 packs/day for 20 years       Types:  Cigarettes   .  Smokeless tobacco:  Never Used   .  Alcohol Use:  Yes         Comment: rarely       OB History      Grav  Para  Term  Preterm  Abortions  TAB  SAB  Ect  Mult  Living                                Review of Systems  Constitutional: Negative.  Negative for appetite change.  HENT: Negative.   Eyes: Negative.   Respiratory: Negative.   Cardiovascular: Negative.   Gastrointestinal: Positive for blood in stool, hematochezia, anal bleeding and rectal pain. Negative for nausea, vomiting and diarrhea.  Genitourinary: Negative.   Musculoskeletal: Negative.   Skin: Negative.   Neurological: Negative.   All other systems reviewed and are negative.    Allergies    Blue dyes (parenteral) and Sulfa antibiotics    Home Medications       Current Outpatient Rx   Name    Route    Sig    Dispense    Refill   .  VITAMIN D3 2000 UNITS PO TABS      Oral      Take 1 capsule by mouth daily.                 Marland Kitchen  DOCUSATE SODIUM 100 MG PO CAPS      Oral      Take 100 mg by mouth 2 (two) times daily.                 Marland Kitchen  HYDROCODONE-ACETAMINOPHEN 5-325 MG PO TABS      Oral      Take 1-2 tablets by mouth every 4 (four) hours as needed. Pain                   .  METOPROLOL TARTRATE 25 MG PO TABS      Oral      Take 25 mg by mouth 2 (two) times daily.                 Marland Kitchen  MENOPAUSE FORMULA PO      Oral      Take 2 tablets by mouth 2 (two) times daily. (Profema)                 .  FISH OIL 1000 MG PO CAPS      Oral      Take 1 capsule by mouth daily.                 Marland Kitchen  OMEPRAZOLE 20 MG PO CPDR      Oral      Take 20 mg by mouth every morning.                   .  PSYLLIUM 0.52 G PO CAPS      Oral      Take 0.52 g by mouth 2 (two) times daily.                    BP 127/90  Pulse 79  Temp 99 F (37.2 C) (Oral)  Resp 20  SpO2 100%   Physical Exam  Nursing note and vitals reviewed. Constitutional: She is oriented to person, place, and time. She appears well-developed and well-nourished. No distress. Patient with intermittent bouts of grimacing and moaning. She appears uncomfortable.  She attributes this  to rectal spasms.    HENT:   Head: Normocephalic and atraumatic.  Eyes: Conjunctivae normal and EOM are normal. Pupils are equal, round, and reactive to light. No scleral icterus.  Neck: Normal range of motion.  Cardiovascular: Normal rate, regular rhythm and normal heart sounds.  Exam reveals no gallop and no friction rub.    No murmur heard. Pulmonary/Chest: Effort normal and breath sounds normal. No respiratory distress.  Abdominal: Soft, obese abdomen. Bowel sounds are normal. She exhibits no distension and no mass. There is no tenderness. There is no guarding.  Neurological: She is alert and oriented to person, place, and time.  Skin: Skin is warm and dry. She is not diaphoretic.         ED Course    Procedures (including critical care time)    Labs Reviewed   CBC WITH DIFFERENTIAL - Abnormal; Notable for the following:      Platelets  105 (*)         All other components within normal limits   COMPREHENSIVE METABOLIC PANEL - Abnormal; Notable for the following:      AST  342 (*)          ALT  578 (*)         Alkaline Phosphatase  146 (*)         All other components within normal limits   TYPE AND SCREEN   ABO/RH    Dg Abd Acute W/chest   01/28/2012  *RADIOLOGY REPORT*  Clinical Data: Rule out perforation, rectal polyps and pain  ACUTE ABDOMEN SERIES (ABDOMEN 2 VIEW & CHEST 1 VIEW)  Comparison: 08/25/2010  Findings: Cardiomediastinal silhouette is stable.  No acute infiltrate or pleural effusion.  No pulmonary edema.  There is nonspecific nonobstructive bowel gas pattern. Post cholecystectomy surgical clips are noted.  Moderate stool noted in the right colon and transverse colon.  No free abdominal air.  IMPRESSION: No acute disease.  Moderate stool noted in the right and transverse colon.  Post cholecystectomy surgical clips are noted.  Nonspecific nonobstructive bowel gas pattern.  No free abdominal air.   Original Report Authenticated By: Natasha Mead, M.D.         1.  Rectal pain            MDM    Patient without perforation.  She has rectal spasms.  Patient seen in shared visit with Dr. Patria Mane.  Patient witll be given B&O suppositories.  She will f/u with surgery. Discussed reasons to seek immediate care. Patient expresses understanding and agrees with plan.     Arthor Captain, PA-C 02/17/12 1751

## 2012-02-18 NOTE — ED Provider Notes (Signed)
Medical screening examination/treatment/procedure(s) were conducted as a shared visit with non-physician practitioner(s) and myself.  I personally evaluated the patient during the encounter  Spoke with [facility] who recommends close follow up in surgical clinic and B and O suppositories. Well appearing.  Lyanne Co, MD 02/18/12 [phone]

## 2012-04-24 ENCOUNTER — Encounter (HOSPITAL_BASED_OUTPATIENT_CLINIC_OR_DEPARTMENT_OTHER): Payer: Self-pay | Admitting: *Deleted

## 2012-04-26 ENCOUNTER — Other Ambulatory Visit (INDEPENDENT_AMBULATORY_CARE_PROVIDER_SITE_OTHER): Payer: Self-pay | Admitting: General Surgery

## 2012-04-26 ENCOUNTER — Encounter (HOSPITAL_BASED_OUTPATIENT_CLINIC_OR_DEPARTMENT_OTHER): Payer: Self-pay | Admitting: *Deleted

## 2012-04-26 NOTE — Progress Notes (Signed)
To [facility] at 0615-Istat on arrival-Npo after Mn-will take metoprolol,prilosec that am with small amt water.Orders pending from Dr Maisie Fus.office made aware.

## 2012-04-27 ENCOUNTER — Encounter (HOSPITAL_BASED_OUTPATIENT_CLINIC_OR_DEPARTMENT_OTHER): Payer: Self-pay | Admitting: *Deleted

## 2012-04-27 ENCOUNTER — Encounter (HOSPITAL_BASED_OUTPATIENT_CLINIC_OR_DEPARTMENT_OTHER): Payer: Self-pay | Admitting: Anesthesiology

## 2012-04-27 ENCOUNTER — Encounter (HOSPITAL_BASED_OUTPATIENT_CLINIC_OR_DEPARTMENT_OTHER)
Admission: RE | Disposition: A | Discharge status: Home / Self Care | Payer: Self-pay | Source: Ambulatory Visit | Attending: General Surgery

## 2012-04-27 ENCOUNTER — Telehealth (INDEPENDENT_AMBULATORY_CARE_PROVIDER_SITE_OTHER): Payer: Self-pay | Admitting: General Surgery

## 2012-04-27 ENCOUNTER — Ambulatory Visit (HOSPITAL_BASED_OUTPATIENT_CLINIC_OR_DEPARTMENT_OTHER)
Admission: RE | Admit: 2012-04-27 | Discharge: 2012-04-27 | Disposition: A | Discharge status: Home / Self Care | Payer: BC Managed Care – PPO | Source: Ambulatory Visit | Attending: General Surgery | Admitting: General Surgery

## 2012-04-27 ENCOUNTER — Ambulatory Visit (HOSPITAL_BASED_OUTPATIENT_CLINIC_OR_DEPARTMENT_OTHER): Payer: BC Managed Care – PPO | Admitting: Anesthesiology

## 2012-04-27 DIAGNOSIS — K219 Gastro-esophageal reflux disease without esophagitis: Secondary | ICD-10-CM | POA: Insufficient documentation

## 2012-04-27 DIAGNOSIS — K6282 Dysplasia of anus: Secondary | ICD-10-CM

## 2012-04-27 DIAGNOSIS — F17200 Nicotine dependence, unspecified, uncomplicated: Secondary | ICD-10-CM | POA: Insufficient documentation

## 2012-04-27 DIAGNOSIS — I10 Essential (primary) hypertension: Secondary | ICD-10-CM | POA: Insufficient documentation

## 2012-04-27 DIAGNOSIS — E785 Hyperlipidemia, unspecified: Secondary | ICD-10-CM | POA: Insufficient documentation

## 2012-04-27 DIAGNOSIS — Z79899 Other long term (current) drug therapy: Secondary | ICD-10-CM | POA: Insufficient documentation

## 2012-04-27 LAB — POCT I-STAT, CHEM 8
BUN: 11 mg/dL (ref 6–23)
Chloride: 107 mEq/L (ref 96–112)
HCT: 46 % (ref 36.0–46.0)
Sodium: 144 mEq/L (ref 135–145)

## 2012-04-27 SURGERY — ANOSCOPY, HIGH RESOLUTION
Anesthesia: Monitor Anesthesia Care | Site: Buttocks | Wound class: Contaminated

## 2012-04-27 MED ORDER — MIDAZOLAM HCL 5 MG/5ML IJ SOLN
INTRAMUSCULAR | Status: DC | PRN
  Administered 2012-04-27: 1 mg via INTRAVENOUS
  Administered 2012-04-27: 0.5 mg via INTRAVENOUS
  Administered 2012-04-27: 1 mg via INTRAVENOUS
  Administered 2012-04-27 (×3): 0.5 mg via INTRAVENOUS

## 2012-04-27 MED ORDER — LIDOCAINE 5 % EX OINT
TOPICAL_OINTMENT | CUTANEOUS | Status: DC | PRN
  Administered 2012-04-27: 1

## 2012-04-27 MED ORDER — FENTANYL CITRATE 0.05 MG/ML IJ SOLN
INTRAMUSCULAR | Status: DC | PRN
  Administered 2012-04-27 (×4): 25 ug via INTRAVENOUS
  Administered 2012-04-27: 50 ug via INTRAVENOUS
  Administered 2012-04-27 (×3): 25 ug via INTRAVENOUS
  Administered 2012-04-27: 50 ug via INTRAVENOUS
  Administered 2012-04-27: 25 ug via INTRAVENOUS

## 2012-04-27 MED ORDER — LACTATED RINGERS IV SOLN
INTRAVENOUS | Status: DC
  Filled 2012-04-27: qty 1000

## 2012-04-27 MED ORDER — LACTATED RINGERS IV SOLN
INTRAVENOUS | Status: DC
  Administered 2012-04-27 (×3): via INTRAVENOUS
  Filled 2012-04-27: qty 1000

## 2012-04-27 MED ORDER — PROPOFOL 10 MG/ML IV EMUL
INTRAVENOUS | Status: DC | PRN
  Administered 2012-04-27: 50 ug/kg/min via INTRAVENOUS

## 2012-04-27 MED ORDER — PROMETHAZINE HCL 25 MG/ML IJ SOLN
6.2500 mg | INTRAMUSCULAR | Status: DC | PRN
  Filled 2012-04-27: qty 1

## 2012-04-27 MED ORDER — ACETIC ACID 5 % SOLN
Status: DC | PRN
  Administered 2012-04-27: 1 via TOPICAL

## 2012-04-27 MED ORDER — OXYCODONE HCL 5 MG PO TABS: 5.0000 mg | ORAL_TABLET | ORAL | Status: DC | PRN

## 2012-04-27 MED ORDER — DEXAMETHASONE SODIUM PHOSPHATE 4 MG/ML IJ SOLN
INTRAMUSCULAR | Status: DC | PRN
  Administered 2012-04-27: 10 mg via INTRAVENOUS

## 2012-04-27 MED ORDER — PSYLLIUM 28 % PO PACK: 1.0000 | PACK | Freq: Two times a day (BID) | ORAL | Status: DC

## 2012-04-27 MED ORDER — ONDANSETRON HCL 4 MG/2ML IJ SOLN
INTRAMUSCULAR | Status: DC | PRN
  Administered 2012-04-27: 4 mg via INTRAVENOUS

## 2012-04-27 MED ORDER — FENTANYL CITRATE 0.05 MG/ML IJ SOLN
25.0000 ug | INTRAMUSCULAR | Status: DC | PRN
  Filled 2012-04-27: qty 1

## 2012-04-27 MED ORDER — MEPERIDINE HCL 25 MG/ML IJ SOLN
6.2500 mg | INTRAMUSCULAR | Status: DC | PRN
  Filled 2012-04-27: qty 1

## 2012-04-27 MED ORDER — DOCUSATE SODIUM 100 MG PO CAPS: 100.0000 mg | ORAL_CAPSULE | Freq: Three times a day (TID) | ORAL | Status: DC

## 2012-04-27 MED ORDER — BUPIVACAINE-EPINEPHRINE 0.25% -1:200000 IJ SOLN
INTRAMUSCULAR | Status: DC | PRN
  Administered 2012-04-27: 30 mL

## 2012-04-27 SURGICAL SUPPLY — 47 items
BENZOIN TINCTURE PRP APPL 2/3 (GAUZE/BANDAGES/DRESSINGS) ×2 IMPLANT
BLADE HEX COATED 2.75 (ELECTRODE) IMPLANT
BLADE SURG 15 STRL LF DISP TIS (BLADE) ×1 IMPLANT
BLADE SURG 15 STRL SS (BLADE) ×1
CANISTER SUCTION 2500CC (MISCELLANEOUS) ×2 IMPLANT
CLOTH BEACON ORANGE TIMEOUT ST (SAFETY) ×2 IMPLANT
COVER TABLE BACK 60X90 (DRAPES) ×2 IMPLANT
DECANTER SPIKE VIAL GLASS SM (MISCELLANEOUS) ×2 IMPLANT
DRAPE LG THREE QUARTER DISP (DRAPES) ×4 IMPLANT
DRAPE PED LAPAROTOMY (DRAPES) ×2 IMPLANT
DRAPE UNDERBUTTOCKS STRL (DRAPE) IMPLANT
DRSG PAD ABDOMINAL 8X10 ST (GAUZE/BANDAGES/DRESSINGS) IMPLANT
ELECT REM PT RETURN 9FT ADLT (ELECTROSURGICAL) ×2
ELECTRODE REM PT RTRN 9FT ADLT (ELECTROSURGICAL) ×1 IMPLANT
GAUZE SPONGE 4X4 16PLY XRAY LF (GAUZE/BANDAGES/DRESSINGS) IMPLANT
GAUZE VASELINE 3X9 (GAUZE/BANDAGES/DRESSINGS) IMPLANT
GLOVE BIO SURGEON STRL SZ 6.5 (GLOVE) ×4 IMPLANT
GLOVE BIO SURGEON STRL SZ8 (GLOVE) ×2 IMPLANT
GLOVE BIOGEL PI IND STRL 6.5 (GLOVE) ×1 IMPLANT
GLOVE BIOGEL PI IND STRL 7.0 (GLOVE) ×1 IMPLANT
GLOVE BIOGEL PI INDICATOR 6.5 (GLOVE) ×1
GLOVE BIOGEL PI INDICATOR 7.0 (GLOVE) ×1
GLOVE ECLIPSE 6.5 STRL STRAW (GLOVE) ×2 IMPLANT
GOWN PREVENTION PLUS LG XLONG (DISPOSABLE) ×2 IMPLANT
GOWN PREVENTION PLUS XLARGE (GOWN DISPOSABLE) IMPLANT
GOWN PREVENTION PLUS XXLARGE (GOWN DISPOSABLE) ×2 IMPLANT
GOWN STRL REIN XL XLG (GOWN DISPOSABLE) ×2 IMPLANT
LEGGING LITHOTOMY PAIR STRL (DRAPES) IMPLANT
NDL SAFETY ECLIPSE 18X1.5 (NEEDLE) IMPLANT
NEEDLE HYPO 18GX1.5 SHARP (NEEDLE)
NEEDLE HYPO 25X1 1.5 SAFETY (NEEDLE) ×2 IMPLANT
NS IRRIG 500ML POUR BTL (IV SOLUTION) ×2 IMPLANT
PACK BASIN DAY SURGERY FS (CUSTOM PROCEDURE TRAY) ×2 IMPLANT
PENCIL BUTTON HOLSTER BLD 10FT (ELECTRODE) ×2 IMPLANT
SPONGE GAUZE 4X4 12PLY (GAUZE/BANDAGES/DRESSINGS) IMPLANT
SPONGE SURGIFOAM ABS GEL 12-7 (HEMOSTASIS) IMPLANT
SUT CHROMIC 2 0 SH (SUTURE) IMPLANT
SUT CHROMIC 3 0 SH 27 (SUTURE) ×4 IMPLANT
SUT MON AB 3-0 SH 27 (SUTURE)
SUT MON AB 3-0 SH27 (SUTURE) IMPLANT
SUT VIC AB 4-0 P-3 18XBRD (SUTURE) IMPLANT
SUT VIC AB 4-0 P3 18 (SUTURE)
SYR CONTROL 10ML LL (SYRINGE) ×2 IMPLANT
TOWEL OR 17X26 10 PK STRL BLUE (TOWEL DISPOSABLE) ×4 IMPLANT
TRAY DSU PREP LF (CUSTOM PROCEDURE TRAY) ×2 IMPLANT
TUBE CONNECTING 12X1/4 (SUCTIONS) ×2 IMPLANT
YANKAUER SUCT BULB TIP NO VENT (SUCTIONS) IMPLANT

## 2012-04-27 NOTE — Anesthesia Procedure Notes (Addendum)
Procedure Name: MAC Date/Time: 04/27/2012 7:26 AM Performed by: Fran Lowes Pre-anesthesia Checklist: Patient identified, Timeout performed, Emergency Drugs available, Suction available and Patient being monitored Patient Re-evaluated:Patient Re-evaluated prior to inductionOxygen Delivery Method: Simple face mask Intubation Type: IV induction

## 2012-04-27 NOTE — Anesthesia Preprocedure Evaluation (Addendum)
Anesthesia Evaluation  Patient identified by MRN, date of birth, ID band Patient awake    Reviewed: Allergy & Precautions, H&P , NPO status , Patient's Chart, lab work & pertinent test results  Airway Mallampati: II TM Distance: >3 FB Neck ROM: Full    Dental No notable dental hx.    Pulmonary Current Smoker,  breath sounds clear to auscultation  Pulmonary exam normal       Cardiovascular hypertension, Pt. on home beta blockers Rhythm:Regular Rate:Normal     Neuro/Psych negative neurological ROS  negative psych ROS   GI/Hepatic Neg liver ROS, GERD-  Medicated and Controlled,  Endo/Other  negative endocrine ROS  Renal/GU negative Renal ROS  negative genitourinary   Musculoskeletal negative musculoskeletal ROS (+)   Abdominal   Peds negative pediatric ROS (+)  Hematology negative hematology ROS (+)   Anesthesia Other Findings   Reproductive/Obstetrics negative OB ROS                           Anesthesia Physical Anesthesia Plan  ASA: II  Anesthesia Plan: MAC   Post-op Pain Management:    Induction: Intravenous  Airway Management Planned: Simple Face Mask  Additional Equipment:   Intra-op Plan:   Post-operative Plan:   Informed Consent: I have reviewed the patients History and Physical, chart, labs and discussed the procedure including the risks, benefits and alternatives for the proposed anesthesia with the patient or authorized representative who has indicated his/her understanding and acceptance.   Dental advisory given  Plan Discussed with: CRNA  Anesthesia Plan Comments: (Prone MAC)       Anesthesia Quick Evaluation                                   Anesthesia Evaluation  Patient identified by MRN, date of birth, ID band Patient awake    Reviewed: Allergy & Precautions, H&P , NPO status , Patient's Chart, lab work & pertinent test results, reviewed documented  beta blocker date and time   Airway Mallampati: II TM Distance: >3 FB Neck ROM: full    Dental No notable dental hx. (+) Teeth Intact and Dental Advisory Given   Pulmonary neg pulmonary ROS,  breath sounds clear to auscultation  Pulmonary exam normal       Cardiovascular Exercise Tolerance: Good hypertension, Pt. on home beta blockers Rhythm:regular Rate:Normal     Neuro/Psych negative neurological ROS  negative psych ROS   GI/Hepatic negative GI ROS, Neg liver ROS, GERD-  Medicated and Controlled,  Endo/Other  negative endocrine ROS  Renal/GU negative Renal ROS  negative genitourinary   Musculoskeletal   Abdominal   Peds  Hematology negative hematology ROS (+)   Anesthesia Other Findings   Reproductive/Obstetrics negative OB ROS                           Anesthesia Physical Anesthesia Plan  ASA: II  Anesthesia Plan: General   Post-op Pain Management:    Induction: Intravenous  Airway Management Planned: LMA  Additional Equipment:   Intra-op Plan:   Post-operative Plan:   Informed Consent: I have reviewed the patients History and Physical, chart, labs and discussed the procedure including the risks, benefits and alternatives for the proposed anesthesia with the patient or authorized representative who has indicated his/her understanding and acceptance.   Dental Advisory Given  Plan  Discussed with: CRNA and Surgeon  Anesthesia Plan Comments:         Anesthesia Quick Evaluation                                   Anesthesia Evaluation  Patient identified by MRN, date of birth, ID band Patient awake    Reviewed: Allergy & Precautions, H&P , NPO status , Patient's Chart, lab work & pertinent test results, reviewed documented beta blocker date and time   Airway Mallampati: II TM Distance: >3 FB Neck ROM: full    Dental No notable dental hx. (+) Teeth Intact and Dental Advisory Given    Pulmonary neg pulmonary ROS,  breath sounds clear to auscultation  Pulmonary exam normal       Cardiovascular Exercise Tolerance: Good hypertension, Pt. on home beta blockers Rhythm:regular Rate:Normal     Neuro/Psych negative neurological ROS  negative psych ROS   GI/Hepatic negative GI ROS, Neg liver ROS, GERD-  Medicated and Controlled,  Endo/Other  negative endocrine ROS  Renal/GU negative Renal ROS  negative genitourinary   Musculoskeletal   Abdominal   Peds  Hematology negative hematology ROS (+)   Anesthesia Other Findings   Reproductive/Obstetrics negative OB ROS                           Anesthesia Physical Anesthesia Plan  ASA: II  Anesthesia Plan: General   Post-op Pain Management:    Induction: Intravenous  Airway Management Planned: LMA  Additional Equipment:   Intra-op Plan:   Post-operative Plan:   Informed Consent: I have reviewed the patients History and Physical, chart, labs and discussed the procedure including the risks, benefits and alternatives for the proposed anesthesia with the patient or authorized representative who has indicated his/her understanding and acceptance.   Dental Advisory Given  Plan Discussed with: CRNA and Surgeon  Anesthesia Plan Comments:         Anesthesia Quick Evaluation

## 2012-04-27 NOTE — Transfer of Care (Signed)
Immediate Anesthesia Transfer of Care Note  Patient: Tina Freeman  Procedure(s) Performed: Procedure(s) (LRB): HIGH RESOLUTION ANOSCOPY (N/A)  Patient Location: Patient transported to PACU with oxygen via face mask at 4 Liters / Min  Anesthesia Type: General  Level of Consciousness: awake and alert   Airway & Oxygen Therapy: Patient Spontanous Breathing and Patient connected to face mask oxygen  Post-op Assessment: Report given to PACU RN and Post -op Vital signs reviewed and stable  Post vital signs: Reviewed and stable  Dentition: Teeth and oropharynx remain in pre-op condition  Complications: No apparent anesthesia complications

## 2012-04-27 NOTE — Anesthesia Postprocedure Evaluation (Signed)
  Anesthesia Post-op Note  Patient: Tina Freeman  Procedure(s) Performed: Procedure(s) (LRB): HIGH RESOLUTION ANOSCOPY (N/A)  Patient Location: PACU  Anesthesia Type: MAC  Level of Consciousness: awake and alert   Airway and Oxygen Therapy: Patient Spontanous Breathing  Post-op Pain: mild  Post-op Assessment: Post-op Vital signs reviewed, Patient's Cardiovascular Status Stable, Respiratory Function Stable, Patent Airway and No signs of Nausea or vomiting  Last Vitals:  Filed Vitals:   04/27/12 0853  BP: 119/65  Pulse:   Temp:   Resp:     Post-op Vital Signs: stable   Complications: No apparent anesthesia complications

## 2012-04-27 NOTE — Op Note (Signed)
04/27/2012  8:21 AM  PATIENT:  Tina Freeman  37 y.o. female  Patient Care Team: Hadley Pen as PCP - General (Family Medicine)  PRE-OPERATIVE DIAGNOSIS:  ANAL DYSPLASIA  POST-OPERATIVE DIAGNOSIS:  ANAL DYSPLASIA  PROCEDURE:  Procedure(s): HIGH RESOLUTION ANOSCOPY WITH BIOPSY  SURGEON:  Surgeon(s): Romie Levee, MD  ASSISTANT: none   ANESTHESIA:   local and MAC  EBL: 20ml  Total I/O In: 1000 [I.V.:1000] Out: -    SPECIMEN:  Source of Specimen:  anal canal  DISPOSITION OF SPECIMEN:  PATHOLOGY  COUNTS:  YES  PLAN OF CARE: Discharge to home after PACU  PATIENT DISPOSITION:  PACU - hemodynamically stable.  INDICATION: 37yo F with HGD on previous HRA 3 months ago  OR FINDINGS: One plaque like area in L ant canal, patchy areas throughout anal canal  DESCRIPTION: The patient was identified in the preoperative holding area and taken to the OR where they were laid prone on the operating room table in jackknife position. MAC anesthesia was smoothly induced.  The patient was then prepped and draped in the usual sterile fashion. A surgical timeout was performed indicating the correct patient, procedure, positioning and preoperative antibioitics. SCDs were noted to be in place and functioning prior to the operation. A rectal block was performed using 30ml of 0.25% Marcaine with epinephrine.   After this was completed, a sponge was soaked in 5% acetic acid was placed over the perianal region. This was allowed to soak for 2 minutes. The sponge was removed and the perianal region was evaluated.  There were several lesions noted internally but none noted perianally.  The internal anal canal was evaluated via anoscopy with a Hill-Ferguson anoscope.  There were 6 lesions in total identified.  Biopsies were taken at the anterior, L anterior, L lateral, posterior, L posterior and R lateral positions.  The L anterior was a defined plaque.  The R lateral area was the most diffuse.  After this  was completed, hemostasis was achieved with electrocautery and all of the biopsy sites were closed using a 3-0 chromic suture. The patient was awakened from anesthesia and sent to PACU in stable condition.  All counts correct per OR staff.

## 2012-04-27 NOTE — Telephone Encounter (Signed)
I called the pt back and she states she can't "feel her butt".  She wants to know when it will wear off.  She feels in her stomach she needs to have a bm but she can't go.  I talked to Dr Maisie Fus and she said the block should last about 6 hrs from time of surgery.  The feeling will come back.  If she needs to have a bm to sit on the toilet for a little bit.  She said she tried that and it didn't work.  She asked when she should expect feeling back before she calls our office.  I told her if not back today call our office over the weekend.

## 2012-04-27 NOTE — H&P (Signed)
  Madiline Saffran is a 37 y.o. Female who I am following due to a dysplastic lesion found incidentally after a routine hemorrhoidectomy in mid July. Patient has a h/o HPV infections and cervical cancer. She is low risk of having HIV. She does consent to cigarette smoking. She denies any immune disorders. Her last Gyn exam was in Nov, and she goes to these apts regularly.  She reports several episodes of bleeding with BM's recently.    Past Medical History   Diagnosis  Date   .  Hemorrhoids    .  Migraines    .  Hypertension    .  History of UTI    .  GERD (gastroesophageal reflux disease)    .  Heart murmur    .  Hyperlipidemia     Past Surgical History   Procedure  Date   .  Cholecystectomy  2005   .  Tubal ligation  2005   .  Abdominal hysterectomy  2007   .  Oophorectomy  2009   .  Urethral sling  2010   .  Band hemorrhoidectomy  10/07/11     EUA    Current outpatient prescriptions:metoprolol tartrate (LOPRESSOR) 25 MG tablet, Take 25 mg by mouth 2 (two) times daily., Disp: , Rfl: ; Nutritional Supplements (MENOPAUSE FORMULA PO), Take 2 tablets by mouth 2 (two) times daily. (Profema), Disp: , Rfl: ; omeprazole (PRILOSEC) 20 MG capsule, Take 20 mg by mouth daily., Disp: , Rfl:   Allergies   Allergen  Reactions   .  Blue Dyes (Parenteral)  Anaphylaxis and Hives   .  Sulfa Drugs Cross Reactors    Review of Systems - General ROS: negative for - chills or fever  Hematological and Lymphatic ROS: negative for - bleeding problems or bruising  Respiratory ROS: no cough, shortness of breath, or wheezing  Cardiovascular ROS: no chest pain or dyspnea on exertion  Gastrointestinal ROS: no abdominal pain, change in bowel habits, or black or bloody stools   Objective:  General appearance: alert, cooperative and no distress  Perianal: external skin tags, no condyloma noted  Cardiac: regular rate and rhythm  Respiratory: clear to auscultation  GI: soft, non-tender to palpation   Assessment and  Plan:  Ivee Poellnitz is a 37 y.o. female who was found to have a moderately differentiated dysplastic anal lesion on pathology from hemorrhoidectomy. We performed a HRA 3 months ago and found some HGD.  I would like to repeat her high resolution anoscopy today to evaluate for any remaining areas of dysplasia around her anal canal. The procedure was explained in detail to the patient and all her questions were answered. I clearly stated that she does not have cancer at this moment and this is a precaution to help determine her risk and reduce this risk of developing cancer going forward. Risks of procedure are mainly bleeding and post operative pain.  Vanita Panda, MD  Colorectal and General Surgery  [facility]

## 2012-04-27 NOTE — Telephone Encounter (Signed)
Pt calling to ask about the nerve block used today with her procedure.  She is voiding well, but she thinks she feels the pressure to defecate, knowing this is probably wrong.  Please call her.

## 2012-04-30 ENCOUNTER — Telehealth (INDEPENDENT_AMBULATORY_CARE_PROVIDER_SITE_OTHER): Payer: Self-pay | Admitting: General Surgery

## 2012-04-30 NOTE — Telephone Encounter (Signed)
Discussed path results with patient

## 2012-04-30 NOTE — Telephone Encounter (Signed)
Spoke with patient  Gave f/u po appt 05/22/12 12 pm

## 2012-05-22 ENCOUNTER — Encounter (INDEPENDENT_AMBULATORY_CARE_PROVIDER_SITE_OTHER): Payer: BC Managed Care – PPO | Admitting: General Surgery

## 2012-06-01 ENCOUNTER — Ambulatory Visit (INDEPENDENT_AMBULATORY_CARE_PROVIDER_SITE_OTHER): Payer: BC Managed Care – PPO | Admitting: General Surgery

## 2012-06-01 ENCOUNTER — Encounter (INDEPENDENT_AMBULATORY_CARE_PROVIDER_SITE_OTHER): Payer: Self-pay | Admitting: General Surgery

## 2012-06-01 VITALS — BP 130/88 | HR 68 | Temp 97.1°F | Resp 16 | Ht 66.5 in | Wt 216.4 lb

## 2012-06-01 DIAGNOSIS — K6282 Dysplasia of anus: Secondary | ICD-10-CM

## 2012-06-01 NOTE — Patient Instructions (Addendum)
We will schedule you for a repeat anoscopy and ablation in Aug.  Someone should call you to schedule this.  If no one has called, by the end of July, please call the office to schedule.        GETTING TO GOOD BOWEL HEALTH. Irregular bowel habits such as constipation can lead to many problems over time.  Having one soft bowel movement a day is the most important way to prevent further problems.  The anorectal canal is designed to handle stretching and feces to safely manage our ability to get rid of solid waste (feces, poop, stool) out of our body.  BUT, hard constipated stools can act like ripping concrete bricks causing inflamed hemorrhoids, anal fissures, abdominal pain and bloating.     The goal: ONE SOFT BOWEL MOVEMENT A DAY!  To have soft, regular bowel movements:    Drink at least 8 tall glasses of water a day.     Take plenty of fiber.  Fiber is the undigested part of plant food that passes into the colon, acting s "natures broom" to encourage bowel motility and movement.  Fiber can absorb and hold large amounts of water. This results in a larger, bulkier stool, which is soft and easier to pass. Work gradually over several weeks up to 6 servings a day of fiber (25g a day even more if needed) in the form of: o Vegetables -- Root (potatoes, carrots, turnips), leafy green (lettuce, salad greens, celery, spinach), or cooked high residue (cabbage, broccoli, etc) o Fruit -- Fresh (unpeeled skin & pulp), Dried (prunes, apricots, cherries, etc ),  or stewed ( applesauce)  o Whole grain breads, pasta, etc (whole wheat)  o Bran cereals    Bulking Agents -- This type of water-retaining fiber generally is easily obtained each day by one of the following:  o Psyllium bran -- The psyllium plant is remarkable because its ground seeds can retain so much water. This product is available as Metamucil, Konsyl, Effersyllium, Per Diem Fiber, or the less expensive generic preparation in drug and health food  stores. Although labeled a laxative, it really is not a laxative.  o Methylcellulose -- This is another fiber derived from wood which also retains water. It is available as Citrucel. o Polyethylene Glycol - and "artificial" fiber commonly called Miralax or Glycolax.  It is helpful for people with gassy or bloated feelings with regular fiber o Flax Seed - a less gassy fiber than psyllium   No reading or other relaxing activity while on the toilet. If bowel movements take longer than 5 minutes, you are too constipated   AVOID CONSTIPATION.  High fiber and water intake usually takes care of this.  Sometimes a laxative is needed to stimulate more frequent bowel movements, but    Laxatives are not a good long-term solution as it can wear the colon out. o Osmotics (Milk of Magnesia, Fleets phosphosoda, Magnesium citrate, MiraLax, GoLytely) are safer than  o Stimulants (Senokot, Castor Oil, Dulcolax, Ex Lax)    o Do not take laxatives for more than 7days in a row.    IF SEVERELY CONSTIPATED, try a Bowel Retraining Program: o Do not use laxatives.  o Eat a diet high in roughage, such as bran cereals and leafy vegetables.  o Drink six (6) ounces of prune or apricot juice each morning.  o Eat two (2) large servings of stewed fruit each day.  o Take one (1) heaping tablespoon of a psyllium-based bulking agent twice  a day. Use sugar-free sweetener when possible to avoid excessive calories.  o Eat a normal breakfast.  o Set aside 15 minutes after breakfast to sit on the toilet, but do not strain to have a bowel movement.  o If you do not have a bowel movement by the third day, use an enema and repeat the above steps.

## 2012-06-01 NOTE — Progress Notes (Signed)
Nawal Burling is a 37 y.o. female who is status post a HRA and biopsy on 2/7.  She has recovered well.  She only had about a week of pain and then a couple weeks of bleeding with BM's.  She is doing fine now.  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  Objective: Filed Vitals:   06/01/12 0942  BP: 130/88  Pulse: 68  Temp: 97.1 F (36.2 C)  Resp: 16    General appearance: alert and cooperative GI: soft, non-tender; bowel sounds normal; no masses,  no organomegaly  Incision: healing well   Assessment: s/p  Patient Active Problem List  Diagnosis  . Hemorrhoids  . Anal intraepithelial neoplasia II (AIN II)    Plan: OR again in Aug for 6 mo f/u and repeat HRA.  We will try to ablate everything we see with the laser at this time.      Vanita Panda, MD [facility], [place] [phone]   06/01/2012 10:26 AM

## 2012-08-19 HISTORY — PX: DIAGNOSTIC LARYNGOSCOPY: SHX5368

## 2012-10-24 ENCOUNTER — Encounter (HOSPITAL_BASED_OUTPATIENT_CLINIC_OR_DEPARTMENT_OTHER): Payer: Self-pay | Admitting: *Deleted

## 2012-10-24 NOTE — Progress Notes (Signed)
NPO AFTER MN. ARRIVES AT 0600. NEEDS ISTAT AND EKG. WILL TAKE METOPROLOL, PRILOSEC, AND VIT D3 AM OF SURG W/ SIPS OF WATER.

## 2012-10-30 ENCOUNTER — Other Ambulatory Visit (INDEPENDENT_AMBULATORY_CARE_PROVIDER_SITE_OTHER): Payer: Self-pay | Admitting: General Surgery

## 2012-10-30 ENCOUNTER — Encounter (HOSPITAL_COMMUNITY): Payer: Self-pay | Admitting: *Deleted

## 2012-10-30 NOTE — Progress Notes (Signed)
Need orders in EPIC .  Surgery scheduled for 11/01/12.  Thank You.   

## 2012-11-01 ENCOUNTER — Encounter (HOSPITAL_COMMUNITY): Payer: Self-pay | Admitting: Anesthesiology

## 2012-11-01 ENCOUNTER — Encounter (HOSPITAL_COMMUNITY): Payer: Self-pay | Admitting: *Deleted

## 2012-11-01 ENCOUNTER — Encounter (HOSPITAL_COMMUNITY)
Admission: RE | Disposition: A | Discharge status: Home / Self Care | Payer: Self-pay | Source: Ambulatory Visit | Attending: General Surgery

## 2012-11-01 ENCOUNTER — Ambulatory Visit (HOSPITAL_COMMUNITY): Payer: BC Managed Care – PPO | Admitting: Anesthesiology

## 2012-11-01 ENCOUNTER — Encounter (HOSPITAL_BASED_OUTPATIENT_CLINIC_OR_DEPARTMENT_OTHER): Admission: RE | Payer: Self-pay | Source: Ambulatory Visit

## 2012-11-01 ENCOUNTER — Ambulatory Visit (HOSPITAL_COMMUNITY)
Admission: RE | Admit: 2012-11-01 | Discharge: 2012-11-01 | Disposition: A | Discharge status: Home / Self Care | Payer: BC Managed Care – PPO | Source: Ambulatory Visit | Attending: General Surgery | Admitting: General Surgery

## 2012-11-01 ENCOUNTER — Ambulatory Visit (HOSPITAL_BASED_OUTPATIENT_CLINIC_OR_DEPARTMENT_OTHER): Admission: RE | Admit: 2012-11-01 | Payer: BC Managed Care – PPO | Source: Ambulatory Visit | Admitting: General Surgery

## 2012-11-01 DIAGNOSIS — A630 Anogenital (venereal) warts: Secondary | ICD-10-CM

## 2012-11-01 DIAGNOSIS — K219 Gastro-esophageal reflux disease without esophagitis: Secondary | ICD-10-CM | POA: Insufficient documentation

## 2012-11-01 DIAGNOSIS — Z79899 Other long term (current) drug therapy: Secondary | ICD-10-CM | POA: Insufficient documentation

## 2012-11-01 DIAGNOSIS — K6282 Dysplasia of anus: Secondary | ICD-10-CM | POA: Insufficient documentation

## 2012-11-01 DIAGNOSIS — I10 Essential (primary) hypertension: Secondary | ICD-10-CM | POA: Insufficient documentation

## 2012-11-01 HISTORY — PX: HIGH RESOLUTION ANOSCOPY: SHX6345

## 2012-11-01 LAB — BASIC METABOLIC PANEL
Chloride: 106 mEq/L (ref 96–112)
GFR calc Af Amer: >90 mL/min (ref >90)
GFR calc non Af Amer: >90 mL/min (ref >90)
Glucose, Bld: 101 mg/dL — ABNORMAL HIGH (ref 70–99)
Potassium: 3.8 mEq/L (ref 3.5–5.1)
Sodium: 142 mEq/L (ref 135–145)

## 2012-11-01 LAB — CBC
HCT: 40.8 % (ref 36.0–46.0)
Hemoglobin: 13.9 g/dL (ref 12.0–15.0)
MCHC: 34.1 g/dL (ref 30.0–36.0)
RDW: 12.5 % (ref 11.5–15.5)
WBC: 5.2 10*3/uL (ref 4.0–10.5)

## 2012-11-01 SURGERY — ANOSCOPY, HIGH RESOLUTION
Anesthesia: General | Site: Anus | Wound class: Dirty or Infected

## 2012-11-01 SURGERY — ANOSCOPY, HIGH RESOLUTION
Anesthesia: General

## 2012-11-01 MED ORDER — DOCUSATE SODIUM 100 MG PO CAPS: 100.0000 mg | ORAL_CAPSULE | Freq: Two times a day (BID) | ORAL | Status: DC

## 2012-11-01 MED ORDER — ACETIC ACID 5 % SOLN
Status: DC | PRN
  Administered 2012-11-01: 1 via TOPICAL

## 2012-11-01 MED ORDER — ACETAMINOPHEN 325 MG PO TABS: 650.0000 mg | ORAL_TABLET | ORAL | Status: DC | PRN

## 2012-11-01 MED ORDER — FENTANYL CITRATE 0.05 MG/ML IJ SOLN: 25.0000 ug | INTRAMUSCULAR | Status: DC | PRN

## 2012-11-01 MED ORDER — BUPIVACAINE LIPOSOME 1.3 % IJ SUSP
20.0000 mL | Freq: Once | INTRAMUSCULAR | Status: DC
  Filled 2012-11-01: qty 20

## 2012-11-01 MED ORDER — PSYLLIUM 28.3 % PO POWD: ORAL | Status: DC

## 2012-11-01 MED ORDER — FENTANYL CITRATE 0.05 MG/ML IJ SOLN
INTRAMUSCULAR | Status: DC | PRN
  Administered 2012-11-01 (×2): 50 ug via INTRAVENOUS
  Administered 2012-11-01: 100 ug via INTRAVENOUS
  Administered 2012-11-01: 50 ug via INTRAVENOUS

## 2012-11-01 MED ORDER — BUPIVACAINE LIPOSOME 1.3 % IJ SUSP
INTRAMUSCULAR | Status: DC | PRN
  Administered 2012-11-01: 20 mL

## 2012-11-01 MED ORDER — LIDOCAINE 5 % EX OINT: TOPICAL_OINTMENT | CUTANEOUS | Status: DC | PRN

## 2012-11-01 MED ORDER — ACETIC ACID 5 % SOLN
Status: AC
  Filled 2012-11-01: qty 500

## 2012-11-01 MED ORDER — BUPIVACAINE-EPINEPHRINE 0.5% -1:200000 IJ SOLN
INTRAMUSCULAR | Status: AC
  Filled 2012-11-01: qty 1

## 2012-11-01 MED ORDER — LACTATED RINGERS IV SOLN
INTRAVENOUS | Status: DC | PRN
  Administered 2012-11-01: 07:00:00 via INTRAVENOUS

## 2012-11-01 MED ORDER — LACTATED RINGERS IV SOLN: INTRAVENOUS | Status: DC

## 2012-11-01 MED ORDER — DEXAMETHASONE SODIUM PHOSPHATE 10 MG/ML IJ SOLN
INTRAMUSCULAR | Status: DC | PRN
  Administered 2012-11-01: 10 mg via INTRAVENOUS

## 2012-11-01 MED ORDER — PROMETHAZINE HCL 25 MG/ML IJ SOLN: 6.2500 mg | INTRAMUSCULAR | Status: DC | PRN

## 2012-11-01 MED ORDER — MIDAZOLAM HCL 5 MG/5ML IJ SOLN
INTRAMUSCULAR | Status: DC | PRN
  Administered 2012-11-01: 2 mg via INTRAVENOUS

## 2012-11-01 MED ORDER — ONDANSETRON HCL 4 MG/2ML IJ SOLN: 4.0000 mg | Freq: Four times a day (QID) | INTRAMUSCULAR | Status: DC | PRN

## 2012-11-01 MED ORDER — OXYCODONE HCL 5 MG PO TABS: 5.0000 mg | ORAL_TABLET | ORAL | Status: DC | PRN

## 2012-11-01 MED ORDER — PROPOFOL 10 MG/ML IV BOLUS
INTRAVENOUS | Status: DC | PRN
  Administered 2012-11-01: 200 mg via INTRAVENOUS

## 2012-11-01 MED ORDER — BUPIVACAINE-EPINEPHRINE 0.25% -1:200000 IJ SOLN
INTRAMUSCULAR | Status: AC
  Filled 2012-11-01: qty 1

## 2012-11-01 MED ORDER — BUPIVACAINE-EPINEPHRINE PF 0.5-1:200000 % IJ SOLN
INTRAMUSCULAR | Status: DC | PRN
  Administered 2012-11-01: 50 mL

## 2012-11-01 MED ORDER — SUCCINYLCHOLINE CHLORIDE 20 MG/ML IJ SOLN
INTRAMUSCULAR | Status: DC | PRN
  Administered 2012-11-01: 100 mg via INTRAVENOUS

## 2012-11-01 MED ORDER — ACETAMINOPHEN 650 MG RE SUPP
650.0000 mg | RECTAL | Status: DC | PRN
  Filled 2012-11-01: qty 1

## 2012-11-01 MED ORDER — ONDANSETRON HCL 4 MG/2ML IJ SOLN
INTRAMUSCULAR | Status: DC | PRN
  Administered 2012-11-01: 4 mg via INTRAVENOUS

## 2012-11-01 SURGICAL SUPPLY — 33 items
BLADE SURG 15 STRL LF DISP TIS (BLADE) ×1 IMPLANT
BLADE SURG 15 STRL SS (BLADE) ×1
BLADE SURG SZ10 CARB STEEL (BLADE) IMPLANT
BRIEF STRETCH FOR OB PAD LRG (UNDERPADS AND DIAPERS) ×2 IMPLANT
CANISTER SUCTION 2500CC (MISCELLANEOUS) IMPLANT
CLOTH BEACON ORANGE TIMEOUT ST (SAFETY) ×2 IMPLANT
COVER SURGICAL LIGHT HANDLE (MISCELLANEOUS) IMPLANT
DECANTER SPIKE VIAL GLASS SM (MISCELLANEOUS) ×2 IMPLANT
DRAPE LAPAROTOMY T 102X78X121 (DRAPES) ×2 IMPLANT
DRAPE LG THREE QUARTER DISP (DRAPES) IMPLANT
ELECT REM PT RETURN 9FT ADLT (ELECTROSURGICAL) ×2
ELECTRODE REM PT RTRN 9FT ADLT (ELECTROSURGICAL) ×1 IMPLANT
GAUZE SPONGE 4X4 16PLY XRAY LF (GAUZE/BANDAGES/DRESSINGS) ×2 IMPLANT
GLOVE BIOGEL PI IND STRL 7.0 (GLOVE) ×1 IMPLANT
GLOVE BIOGEL PI INDICATOR 7.0 (GLOVE) ×1
GOWN STRL REIN XL XLG (GOWN DISPOSABLE) ×4 IMPLANT
KIT BASIN OR (CUSTOM PROCEDURE TRAY) ×2 IMPLANT
LUBRICANT JELLY K Y 4OZ (MISCELLANEOUS) ×2 IMPLANT
MANIFOLD NEPTUNE II (INSTRUMENTS) ×2 IMPLANT
NDL SAFETY ECLIPSE 18X1.5 (NEEDLE) IMPLANT
NEEDLE HYPO 18GX1.5 SHARP (NEEDLE)
NEEDLE HYPO 25X1 1.5 SAFETY (NEEDLE) ×2 IMPLANT
NS IRRIG 1000ML POUR BTL (IV SOLUTION) ×2 IMPLANT
PACK BASIC VI WITH GOWN DISP (CUSTOM PROCEDURE TRAY) ×2 IMPLANT
PENCIL BUTTON HOLSTER BLD 10FT (ELECTRODE) ×2 IMPLANT
SHEARS HARMONIC 9CM CVD (BLADE) IMPLANT
SPONGE GAUZE 4X4 12PLY (GAUZE/BANDAGES/DRESSINGS) ×2 IMPLANT
SPONGE SURGIFOAM ABS GEL 100 (HEMOSTASIS) IMPLANT
SUT CHROMIC 2 0 SH (SUTURE) IMPLANT
SUT CHROMIC 3 0 SH 27 (SUTURE) IMPLANT
SYR CONTROL 10ML LL (SYRINGE) ×2 IMPLANT
TOWEL OR 17X26 10 PK STRL BLUE (TOWEL DISPOSABLE) ×2 IMPLANT
YANKAUER SUCT BULB TIP 10FT TU (MISCELLANEOUS) ×2 IMPLANT

## 2012-11-01 NOTE — Transfer of Care (Signed)
Immediate Anesthesia Transfer of Care Note  Patient: Tina Freeman  Procedure(s) Performed: Procedure(s): HIGH RESOLUTION ANOSCOPY WITH BIOPSY AND LASER ABLATION (N/A)  Patient Location: PACU  Anesthesia Type:General  Level of Consciousness: awake, alert , oriented and patient cooperative  Airway & Oxygen Therapy: Patient Spontanous Breathing and Patient connected to face mask oxygen  Post-op Assessment: Report given to PACU RN and Post -op Vital signs reviewed and stable  Post vital signs: Reviewed and stable  Complications: No apparent anesthesia complications

## 2012-11-01 NOTE — Anesthesia Preprocedure Evaluation (Signed)
Anesthesia Evaluation  Patient identified by MRN, date of birth, ID band Patient awake    Reviewed: Allergy & Precautions, H&P , NPO status , Patient's Chart, lab work & pertinent test results  Airway Mallampati: II TM Distance: >3 FB Neck ROM: Full    Dental no notable dental hx.    Pulmonary neg pulmonary ROS,  breath sounds clear to auscultation  Pulmonary exam normal       Cardiovascular hypertension, Pt. on medications Rhythm:Regular Rate:Normal     Neuro/Psych negative neurological ROS  negative psych ROS   GI/Hepatic Neg liver ROS, GERD-  Medicated,  Endo/Other  negative endocrine ROS  Renal/GU negative Renal ROS  negative genitourinary   Musculoskeletal negative musculoskeletal ROS (+)   Abdominal   Peds negative pediatric ROS (+)  Hematology negative hematology ROS (+)   Anesthesia Other Findings   Reproductive/Obstetrics negative OB ROS                           Anesthesia Physical Anesthesia Plan  ASA: II  Anesthesia Plan: General   Post-op Pain Management:    Induction: Intravenous  Airway Management Planned: Oral ETT and LMA  Additional Equipment:   Intra-op Plan:   Post-operative Plan: Extubation in OR  Informed Consent: I have reviewed the patients History and Physical, chart, labs and discussed the procedure including the risks, benefits and alternatives for the proposed anesthesia with the patient or authorized representative who has indicated his/her understanding and acceptance.   Dental advisory given  Plan Discussed with: CRNA and Surgeon  Anesthesia Plan Comments:         Anesthesia Quick Evaluation

## 2012-11-01 NOTE — Preoperative (Signed)
Beta Blockers   Reason not to administer Beta Blockers:Not Applicable 

## 2012-11-01 NOTE — Op Note (Addendum)
11/01/2012  8:26 AM  PATIENT:  Tina Freeman  37 y.o. female  Patient Care Team: Hadley Pen as PCP - General (Family Medicine)  PRE-OPERATIVE DIAGNOSIS:  anal intraepithelial neoplasia  POST-OPERATIVE DIAGNOSIS:  anal intraepithelial neoplasia  PROCEDURE:  Procedure(s): HIGH RESOLUTION ANOSCOPY WITH BIOPSY AND LASER ABLATION OF CONDYLOMA  SURGEON:  Surgeon(s): Romie Levee, MD  ASSISTANT: none   ANESTHESIA:   local and general  EBL: 10ml  Total I/O In: 700 [I.V.:700] Out: -   SPECIMEN:  Source of Specimen:  L lateral, posterior, R lateral anal canal  DISPOSITION OF SPECIMEN:  PATHOLOGY  COUNTS:  YES  PLAN OF CARE: Discharge to home after PACU  PATIENT DISPOSITION:  PACU - hemodynamically stable.  INDICATION: 37 y.o. F with h/o AIN  OR FINDINGS: diffuse acetowhite lesions internally, one anterior condyloma  DESCRIPTION: The patient was identified in the preoperative holding area and taken to the OR where they were laid prone on the operating room table in jack knife position. General anesthesia was smoothly induced.  The patient was then prepped and draped in the usual sterile fashion. A surgical timeout was performed indicating the correct patient, procedure, positioning and preoperative antibioitics. SCDs were noted to be in place and functioning prior to the operation.   After this was completed, a sponge was soaked in 5% acetic acid was placed over the perianal region. This was allowed to soak for 2 minutes. The sponge was removed and the perianal region was evaluated with a colposcope.  There were no perianal lesions.  The internal anal canal was evaluated via anoscopy with a Hill-Ferguson anoscope.  There were diffuse aceto-white lesions, mainly in the lateral and posterior regions.  Sample biopsies were taken of the L lateral, posterior and R lateral anal canal.  After this was completed, hemostasis was achieved with electrocautery.  Next the laser was brought  onto the field.  The edges of the operative field were draped with wet towels.  Appropriate ventilation was obtained.  All staff were protected with small particle masks and goggles safe for the laser.  The laser was then activated. All condylomatous lesions were ablated. Hemostasis was then achieved using electrocautery. A thin layer of Silvadene was then placed over the lesions. A sterile dressing was applied over this. The patient was then awakened from anesthesia and sent to the postanesthesia care unit in stable condition. All counts were correct operating room staff.

## 2012-11-01 NOTE — Anesthesia Postprocedure Evaluation (Signed)
  Anesthesia Post-op Note  Patient: Tina Freeman  Procedure(s) Performed: Procedure(s) (LRB): HIGH RESOLUTION ANOSCOPY WITH BIOPSY AND LASER ABLATION (N/A)  Patient Location: PACU  Anesthesia Type: General  Level of Consciousness: awake and alert   Airway and Oxygen Therapy: Patient Spontanous Breathing  Post-op Pain: mild  Post-op Assessment: Post-op Vital signs reviewed, Patient's Cardiovascular Status Stable, Respiratory Function Stable, Patent Airway and No signs of Nausea or vomiting  Last Vitals:  Filed Vitals:   11/01/12 0930  BP: 128/71  Pulse: 80  Temp: 37 C  Resp: 21    Post-op Vital Signs: stable   Complications: No apparent anesthesia complications

## 2012-11-01 NOTE — H&P (Signed)
Tina Freeman is an 37 y.o. female.   Chief Complaint: AIN HPI: The patient presents for follow up of her AIN.  Recently she has underwent an excision of a lesion in her throat, which was removed.  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    Past Medical History  Diagnosis Date  . Migraines   . Hypertension   . GERD (gastroesophageal reflux disease)   . Hyperlipidemia   . Heart murmur ASYMPTOMATIC  . Anal intraepithelial neoplasia I     Past Surgical History  Procedure Laterality Date  . Band hemorrhoidectomy  10/07/11    EUA  . Laparoscopic assisted vaginal hysterectomy  08-20-2008      W/ BILATERAL SALPINGO-OOPHORECTOMY AND LYNX PUBOVAGINAL SLING  . Tubal ligation  02-27-2004    LAPAROSCOPIC W/ FILSHIE CLIPS  . Cholecystectomy  2005  . Anal fissure repair  01/25/2012    Procedure: ANAL FISSURE REPAIR;  Surgeon: Romie Levee, MD;  Location: [facility];  Service: General;  Laterality: N/A;  high resolution anoscopy with biopsy    . Diagnostic laryngoscopy  JUNE 2014    BENIGN BX TOUGH AND THROAT    Family History  Problem Relation Age of Onset  . Cancer Mother     ovarian  . Cancer Father     prostate  . Cancer Sister     ovarian   Social History:  reports that she has been smoking Cigarettes.  She has a 15 pack-year smoking history. She has never used smokeless tobacco. She reports that  drinks alcohol. She reports that she does not use illicit drugs.  Allergies:  Allergies  Allergen Reactions  . Blue Dyes (Parenteral) Anaphylaxis    BLUE DYES IN ALL  MEDICATION'S (INCLUDING CAPSULE'S)  . Sulfa Antibiotics Hives and Swelling    Medications Prior to Admission  Medication Sig Dispense Refill  . Cholecalciferol (VITAMIN D3) 2000 UNITS TABS Take 1 capsule by mouth daily.      . metoprolol tartrate (LOPRESSOR) 25 MG tablet Take 25 mg by mouth 2 (two) times daily.      . Omega-3 Fatty Acids (FISH OIL) 1000 MG CAPS Take 1 capsule by mouth daily.      Marland Kitchen  omeprazole (PRILOSEC) 40 MG capsule Take 40 mg by mouth every morning.        Results for orders placed during the hospital encounter of 11/01/12 (from the past 48 hour(s))  CBC     Status: Abnormal   Collection Time    11/01/12  6:15 AM      Result Value Range   WBC 5.2  4.0 - 10.5 K/uL   RBC 4.68  3.87 - 5.11 MIL/uL   Hemoglobin 13.9  12.0 - 15.0 g/dL   HCT 16.1  09.6 - 04.5 %   MCV 87.2  78.0 - 100.0 fL   MCH 29.7  26.0 - 34.0 pg   MCHC 34.1  30.0 - 36.0 g/dL   RDW 40.9  81.1 - 91.4 %   Platelets 110 (*) 150 - 400 K/uL   Comment: SPECIMEN CHECKED FOR CLOTS     PLATELET COUNT CONFIRMED BY SMEAR     LARGE PLATELETS PRESENT  BASIC METABOLIC PANEL     Status: Abnormal   Collection Time    11/01/12  6:15 AM      Result Value Range   Sodium 142  135 - 145 mEq/L   Potassium 3.8  3.5 - 5.1 mEq/L   Chloride 106  96 -  112 mEq/L   CO2 30  19 - 32 mEq/L   Glucose, Bld 101 (*) 70 - 99 mg/dL   BUN 10  6 - 23 mg/dL   Creatinine, Ser 1.61  0.50 - 1.10 mg/dL   Calcium 9.7  8.4 - 09.6 mg/dL   GFR calc non Af Amer >90  >90 mL/min   GFR calc Af Amer >90  >90 mL/min   Comment: (NOTE)     The eGFR has been calculated using the CKD EPI equation.     This calculation has not been validated in all clinical situations.     eGFR's persistently <90 mL/min signify possible Chronic Kidney     Disease.   No results found.  Review of Systems  Constitutional: Negative for fever and chills.  Eyes: Negative for blurred vision.  Respiratory: Negative for cough, sputum production and shortness of breath.   Cardiovascular: Negative for chest pain and palpitations.  Gastrointestinal: Negative for heartburn, nausea, vomiting and abdominal pain.  Genitourinary: Negative for dysuria, urgency, frequency and hematuria.  Neurological: Negative for dizziness and headaches.  Psychiatric/Behavioral: Negative for depression.    Blood pressure 117/79, pulse 66, temperature 98.6 F (37 C), temperature source  Oral, resp. rate 18, height 5\' 6"  (1.676 m), weight 215 lb 2 oz (97.58 kg), SpO2 99.00%. Physical Exam  Constitutional: She is oriented to person, place, and time. She appears well-developed and well-nourished. No distress.  HENT:  Head: Normocephalic and atraumatic.  Eyes: Conjunctivae are normal. Pupils are equal, round, and reactive to light.  Neck: Normal range of motion.  Cardiovascular: Normal rate and regular rhythm.   Respiratory: Effort normal and breath sounds normal. No stridor. No respiratory distress.  GI: Soft. Bowel sounds are normal. She exhibits no distension. There is no tenderness.  Musculoskeletal: Normal range of motion.  Neurological: She is alert and oriented to person, place, and time.  Skin: Skin is warm and dry. She is not diaphoretic.     Assessment/Plan Tina Freeman is a 37 y.o. F with a h/o AIN.  She is here today for surveillance.  We will do high resolution anoscopy.  I will biopsy anything that appears suspicious.  She would like me to laser ablate if there is a large amt of abnormal tissue but not if there is only a small amt.  She understands the risks of the procedure, which are mainly bleeding, pain and recurrence/progression of her disease.  Tina Freeman C. 11/01/2012, 7:14 AM

## 2012-11-02 ENCOUNTER — Encounter (HOSPITAL_COMMUNITY): Payer: Self-pay | Admitting: General Surgery

## 2012-11-05 ENCOUNTER — Telehealth (INDEPENDENT_AMBULATORY_CARE_PROVIDER_SITE_OTHER): Payer: Self-pay

## 2012-11-05 NOTE — Telephone Encounter (Signed)
Left message for pt to call so I can schedule her follow up visit.  I put her down for 11/22/2012 at 1 pm.  I await her call to confirm.

## 2012-11-06 ENCOUNTER — Telehealth (INDEPENDENT_AMBULATORY_CARE_PROVIDER_SITE_OTHER): Payer: Self-pay

## 2012-11-06 ENCOUNTER — Other Ambulatory Visit (INDEPENDENT_AMBULATORY_CARE_PROVIDER_SITE_OTHER): Payer: Self-pay | Admitting: Surgery

## 2012-11-06 DIAGNOSIS — K6282 Dysplasia of anus: Secondary | ICD-10-CM

## 2012-11-06 MED ORDER — NAPROXEN 500 MG PO TABS: 500.0000 mg | ORAL_TABLET | Freq: Two times a day (BID) | ORAL | Status: DC

## 2012-11-06 MED ORDER — OXYCODONE HCL 5 MG PO TABS: 10.0000 mg | ORAL_TABLET | ORAL | Status: DC | PRN

## 2012-11-06 MED ORDER — HYDROCORTISONE ACE-PRAMOXINE 2.5-1 % RE CREA: TOPICAL_CREAM | Freq: Four times a day (QID) | RECTAL | Status: DC

## 2012-11-06 NOTE — Telephone Encounter (Signed)
I left a message for the patient with Dr Maisie Fus' recommendations.                                                 Message     If her BM's are too loose, would cont fiber and stop or cut in half the stool softener dose.        AT    11/05/2012 Telephone

## 2012-11-06 NOTE — Telephone Encounter (Signed)
Pt called and has numerous questions about her surgery that I could not answer. I advised her I will have Dr Maisie Fus return her call. I advised pt MD off today and may not return call until tomorrow. Pt can be reached at [phone] or [phone].

## 2012-11-06 NOTE — Telephone Encounter (Signed)
Advised patient  Naprosyn,anal pram-Hc was E-Scribed ,Oxy IR 5 mg for patient P /U per Dr. Michaell Cowing Advised to continue warm soaks, if no relief to go to the ER. Patient states she is not going to pick up the Anal pram Hc but she would try the recommendations per Dr. Michaell Cowing orders

## 2012-11-06 NOTE — Progress Notes (Signed)
The patient in severe pain.  Current medications not working.  She thinks she is taking hydrocodone.  Oxycodone noted.  Will do stronger doses of oxycodone.  Add on naproxen.  Can consider Analpram, but that can be irritating on wounds.  It may help.  If unbearable, only option is go to the emergency room to provide IV medicines that we do not have

## 2012-11-06 NOTE — Telephone Encounter (Signed)
I called the pt and gave her a follow up appointment.  I notified her of the bx results.  She states she has been having bowel movements from 9 pm last night to 3 am today.   They have been between normal and diarrhea.  She is on a Stool softener and Metamucil Fiber supplement.  I will let Dr Maisie Fus know to see if she has any advice.

## 2012-11-07 ENCOUNTER — Telehealth (INDEPENDENT_AMBULATORY_CARE_PROVIDER_SITE_OTHER): Payer: Self-pay | Admitting: General Surgery

## 2012-11-07 NOTE — Telephone Encounter (Signed)
Message copied by Vanita Panda on Wed Nov 07, 2012  1:10 PM ------      Message from: Ivory Broad      Created: Wed Nov 07, 2012 10:41 AM       Mrs Popwell says you lasered a mole off her labia and she wants to know why.  She states that wasn't agreed upon.  She has a quarter sized area where it was done.                  [place] Dec ------

## 2012-11-07 NOTE — Telephone Encounter (Signed)
I called the pt and let her know Dr Maisie Fus' advice.  She states she is not having trouble going to the bathroom.  Every time she eats it goes right through her.  I did not think that would be caused by surgery but she said it wasn't happening before that.  She also has questions still about surgery.  I offered for her to come in today but she has no ride.

## 2012-11-07 NOTE — Telephone Encounter (Signed)
Discussed surgery with patient.  She was concerned about an anterior perianal lesion.  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  She expressed understanding of this.

## 2012-11-07 NOTE — Telephone Encounter (Signed)
We will discuss her surgery in more detail at her office apt.  We discussed preoperatively that this would be a painful surgery and she stated complete understanding of this and agreement to the plan.  She should be taking colace TID, she should be using a fiber supplement bid and drinking plenty of fluids, she should be doing Sitz baths QID, she should use the lidocaine cream for relief in between.

## 2012-11-22 ENCOUNTER — Ambulatory Visit (INDEPENDENT_AMBULATORY_CARE_PROVIDER_SITE_OTHER): Payer: BC Managed Care – PPO | Admitting: General Surgery

## 2012-11-22 ENCOUNTER — Encounter (INDEPENDENT_AMBULATORY_CARE_PROVIDER_SITE_OTHER): Payer: Self-pay | Admitting: General Surgery

## 2012-11-22 VITALS — BP 116/84 | HR 76 | Temp 99.0°F | Resp 16 | Ht 66.0 in | Wt 213.4 lb

## 2012-11-22 DIAGNOSIS — Z9889 Other specified postprocedural states: Secondary | ICD-10-CM

## 2012-11-22 NOTE — Progress Notes (Signed)
Tina Freeman is a 37 y.o. female who is status post a laser ablation of condyloma and HRA on 8/14.  She is still having trouble with an anterior perineal lesion healing.  She states that the anal lesions seem to be healing better.  She is having some diarrhea and bleeding with bowel movements.  She is currently using an antibiotic cream that her PCP prescribed.    Objective: Filed Vitals:   11/22/12 1256  BP: 116/84  Pulse: 76  Temp: 99 F (37.2 C)  Resp: 16    General appearance: alert and cooperative  Incision: pt deferred  FINAL DIAGNOSIS Path Diagnosis 1. Anus, biopsy, left lateral canal - BENIGN SQUAMOUS MUCOSA. NO DYSPLASIA OR MALIGNANCY. 2. Anus, biopsy, posterior canal - FOCAL LOW GRADE SQUAMOUS INTRAEPITHELIAL LESION, AIN-I INVOLVING THE EDGE OF THE BIOPSY. - NO EVIDENCE OF INVASIVE CARCINOMA. 3. Anus, biopsy, right lateral canal - LOW GRADE SQUAMOUS INTRAEPITHELIAL LESION, AIN-I INVOLVING THE EDGE OF THE BIOPSY. - NO EVIDENCE OF INVASIVE CARCINOMA.  Assessment: s/p  Patient Active Problem List   Diagnosis Date Noted  . 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 Hemorrhoids 08/03/2011    Plan: Tina Freeman is a 36 y.o. F who is s/p HRA.  She is slowly resolving her wounds.  I think she would benefit from a repeat HRA in 6 months, but she would like to get a 2nd opinion.  I have given her a copy of her records.    Vanita Panda, MD [facility], [place] [phone]   11/22/2012 12:59 PM

## 2013-01-03 NOTE — Telephone Encounter (Signed)
error 

## 2013-03-14 IMAGING — CR DG ABDOMEN ACUTE W/ 1V CHEST
4 series · 4 of 4 positions shown · non-contrast
Comparison: 08/25/2010

CLINICAL DATA: Rule out perforation, rectal polyps and pain

ACUTE ABDOMEN SERIES (ABDOMEN 2 VIEW & CHEST 1 VIEW)

[w chest pa]
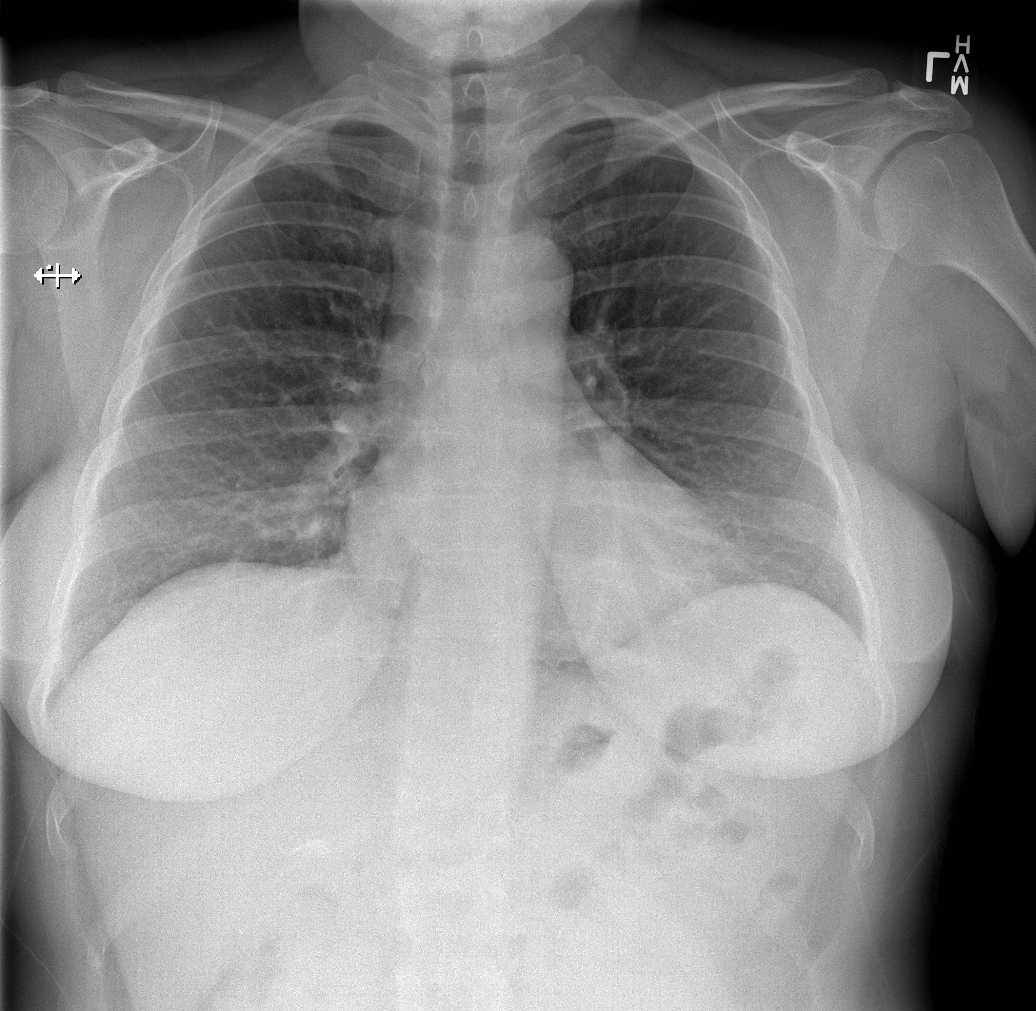

[w abdomen upright]
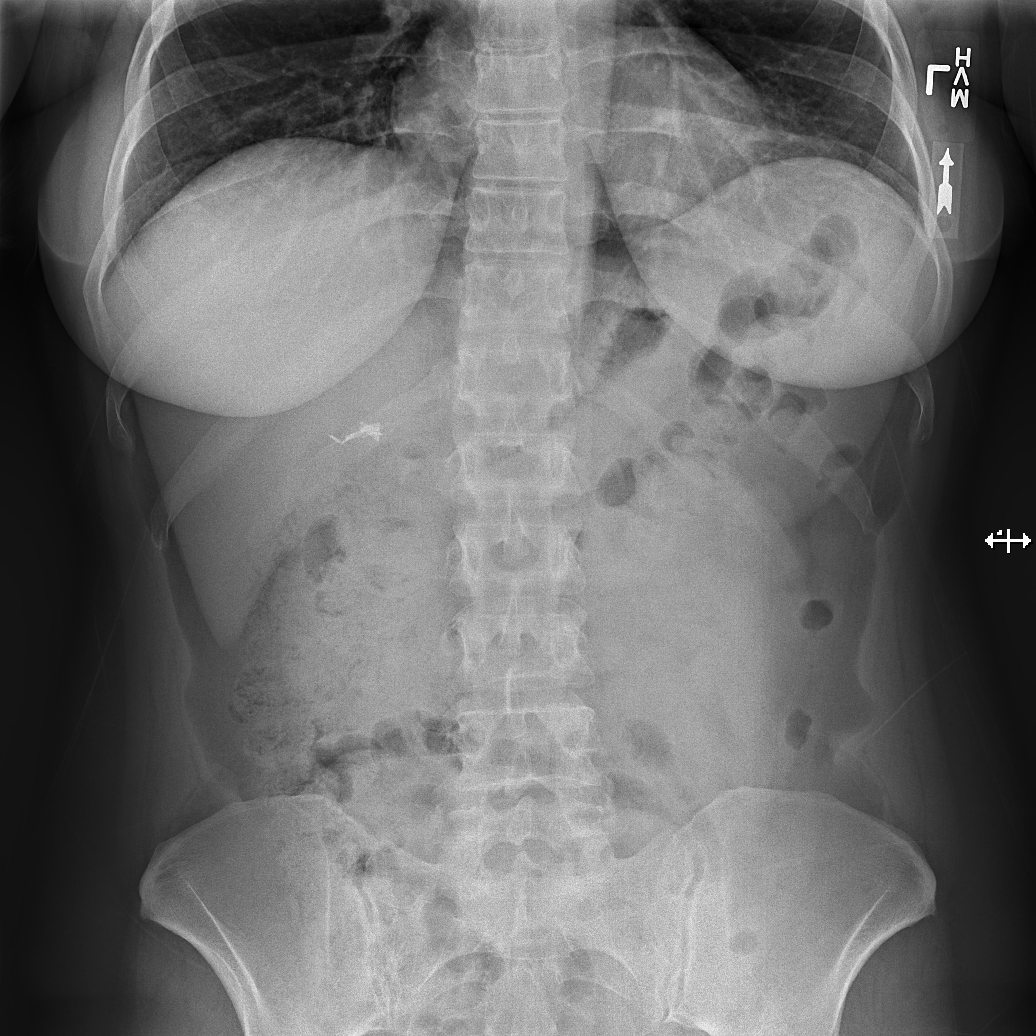

[t abdomen supine (1 of 2)]
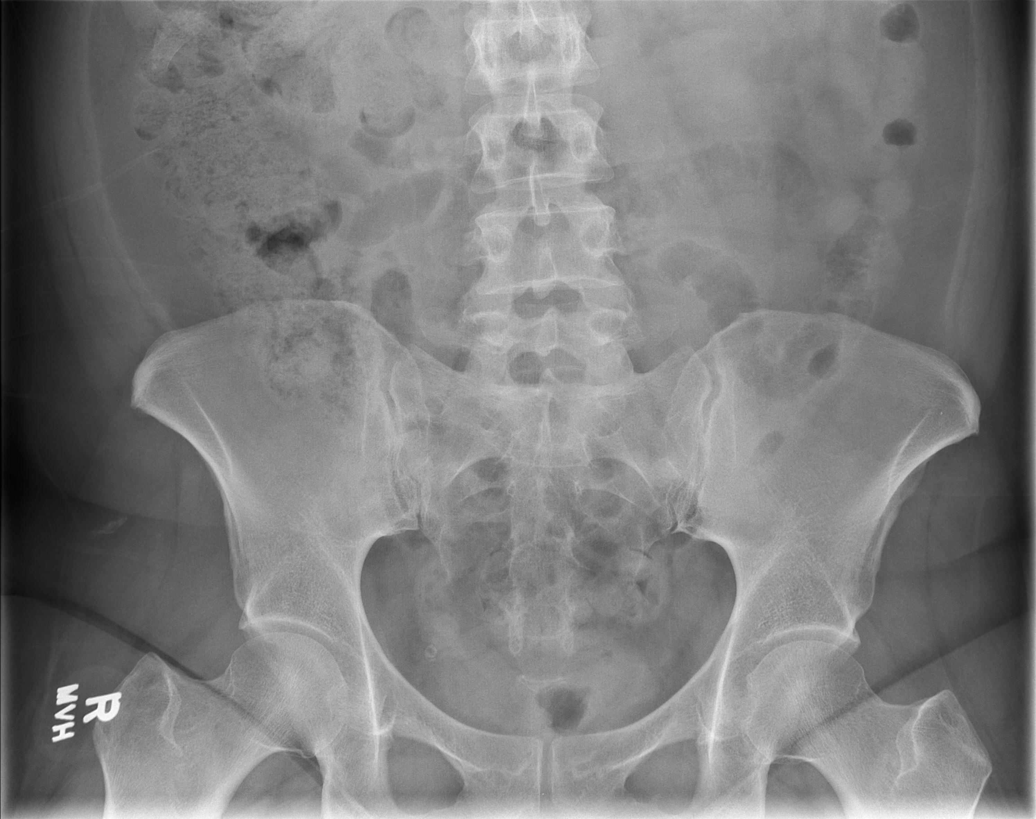

[t abdomen supine (2 of 2)]
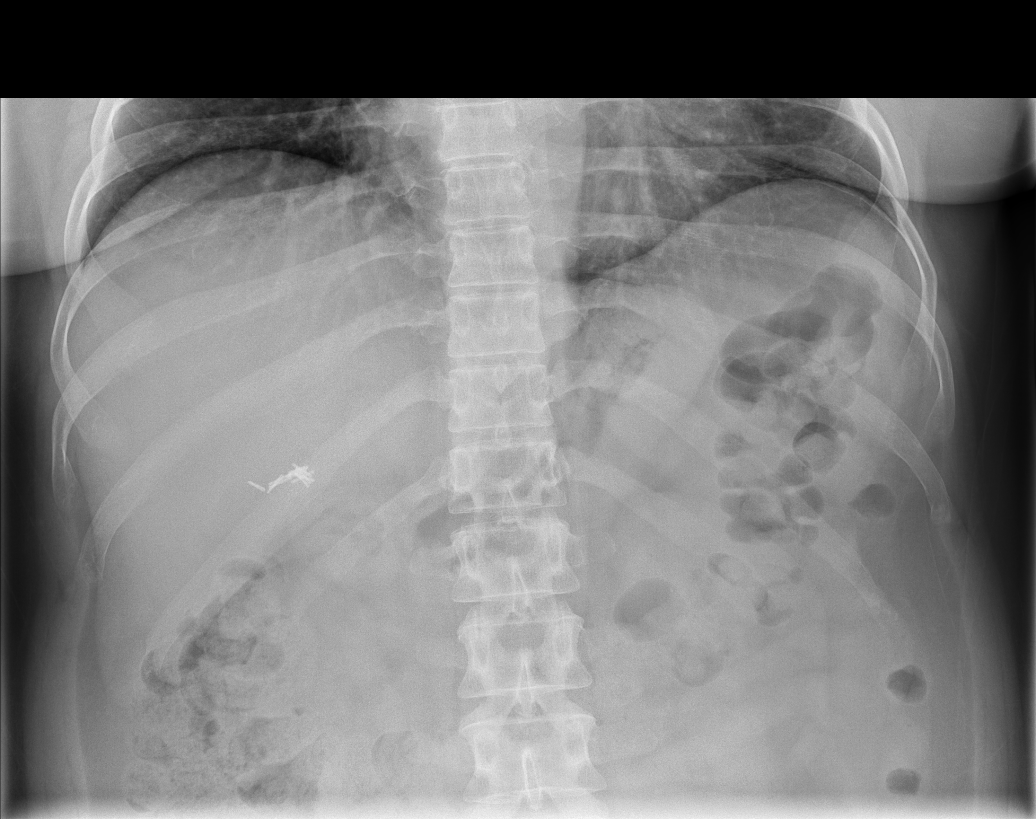

[4 of 4 positions shown; findings below may reference images not displayed]

FINDINGS: Cardiomediastinal silhouette is stable.  No acute
infiltrate or pleural effusion.  No pulmonary edema.  There is
nonspecific nonobstructive bowel gas pattern. Post cholecystectomy
surgical clips are noted.  Moderate stool noted in the right colon
and transverse colon.

No free abdominal air.
IMPRESSION: No acute disease.  Moderate stool noted in the right and transverse
colon.  Post cholecystectomy surgical clips are noted.  Nonspecific
nonobstructive bowel gas pattern.  No free abdominal air.

## 2013-07-11 ENCOUNTER — Other Ambulatory Visit: Payer: Self-pay | Admitting: Obstetrics and Gynecology

## 2013-07-11 DIAGNOSIS — N6310 Unspecified lump in the right breast, unspecified quadrant: Principal | ICD-10-CM

## 2013-07-11 DIAGNOSIS — N6315 Unspecified lump in the right breast, overlapping quadrants: Secondary | ICD-10-CM

## 2013-07-16 ENCOUNTER — Ambulatory Visit
Admission: RE | Admit: 2013-07-16 | Discharge: 2013-07-16 | Disposition: A | Discharge status: Home / Self Care | Payer: BC Managed Care – PPO | Source: Ambulatory Visit | Attending: Obstetrics and Gynecology | Admitting: Obstetrics and Gynecology

## 2013-07-16 ENCOUNTER — Other Ambulatory Visit: Payer: Self-pay | Admitting: Obstetrics and Gynecology

## 2013-07-16 DIAGNOSIS — N6310 Unspecified lump in the right breast, unspecified quadrant: Principal | ICD-10-CM

## 2013-07-16 DIAGNOSIS — N6315 Unspecified lump in the right breast, overlapping quadrants: Secondary | ICD-10-CM

## 2013-07-23 ENCOUNTER — Encounter (INDEPENDENT_AMBULATORY_CARE_PROVIDER_SITE_OTHER): Payer: Self-pay

## 2013-07-23 ENCOUNTER — Ambulatory Visit
Admission: RE | Admit: 2013-07-23 | Discharge: 2013-07-23 | Disposition: A | Discharge status: Home / Self Care | Payer: BC Managed Care – PPO | Source: Ambulatory Visit | Attending: Obstetrics and Gynecology | Admitting: Obstetrics and Gynecology

## 2013-07-23 ENCOUNTER — Other Ambulatory Visit: Payer: Self-pay | Admitting: Obstetrics and Gynecology

## 2013-07-23 DIAGNOSIS — N6315 Unspecified lump in the right breast, overlapping quadrants: Secondary | ICD-10-CM

## 2013-07-23 DIAGNOSIS — N6310 Unspecified lump in the right breast, unspecified quadrant: Principal | ICD-10-CM

## 2013-12-12 ENCOUNTER — Other Ambulatory Visit: Payer: Self-pay | Admitting: Obstetrics and Gynecology

## 2013-12-12 DIAGNOSIS — M7989 Other specified soft tissue disorders: Secondary | ICD-10-CM

## 2014-01-20 ENCOUNTER — Inpatient Hospital Stay: Admission: RE | Admit: 2014-01-20 | Payer: BC Managed Care – PPO | Source: Ambulatory Visit

## 2014-08-31 IMAGING — US US BREAST*R* LIMITED INC AXILLA
1 series · 10 of 10 positions shown · non-contrast
Comparison: None.

ACR Breast Density Category a: The breast tissue is almost entirely
fatty.

ADDENDUM:
The patient returned today 07/23/2013 for biopsy of the mass in the
upper right breast. However, on the initial ultrasound evaluation
prior to biopsy, the mass has an appearance typical of fat
necrosis/oil cyst in that there is a hyperechoic focus with a
hypoechoic center within a subcutaneous fat lobule. The hyperechoic
focus measures approximately 9 x 4 x 7 mm. On the previous spot
tangential diagnostic mammogram, there is fat within the mass, again
confirming the likely diagnosis of fat necrosis.

I discussed the above findings with the patient. She has decided
against biopsy at this time and will undergo short-term interval
followup.
The patient was not charged for today's evaluation.
CLINICAL DATA: Patient presents for evaluation of palpable
abnormality within the 12 o'clock position right breast.
EXAM:
DIGITAL DIAGNOSTIC  BILATERAL MAMMOGRAM WITH CAD
ULTRASOUND RIGHT BREAST

[Series 1: us breast*right* limited inc axilla · 10 of 10 slices shown]
[im 1/10]
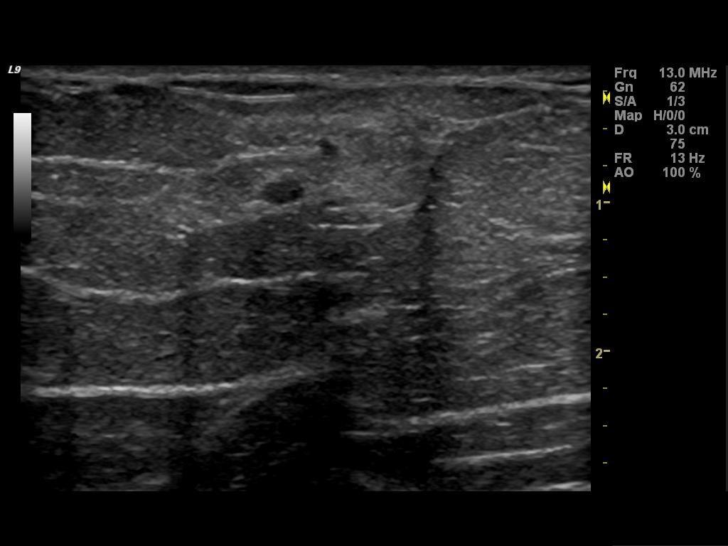
[im 2/10]
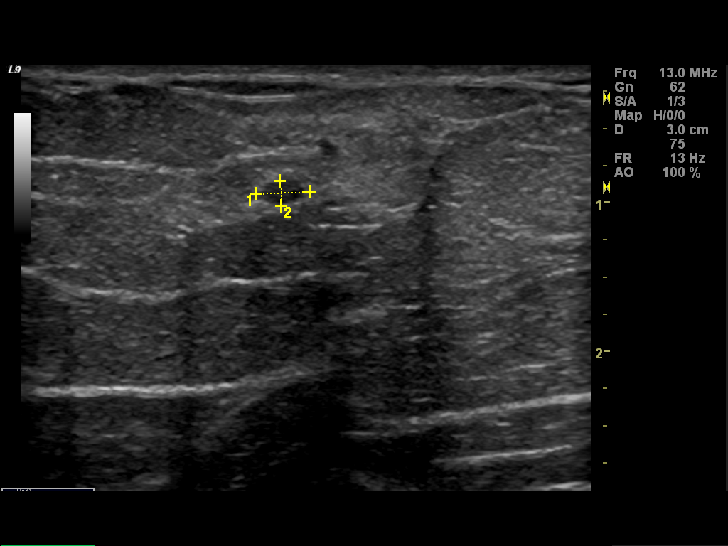
[im 3/10]
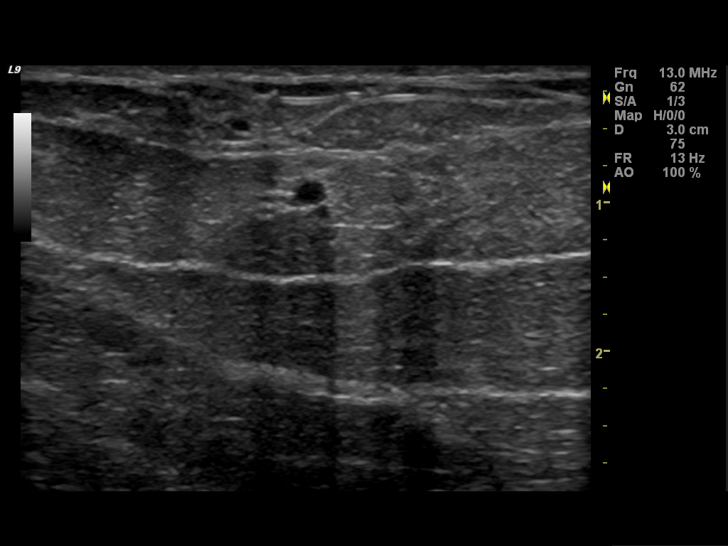
[im 4/10]
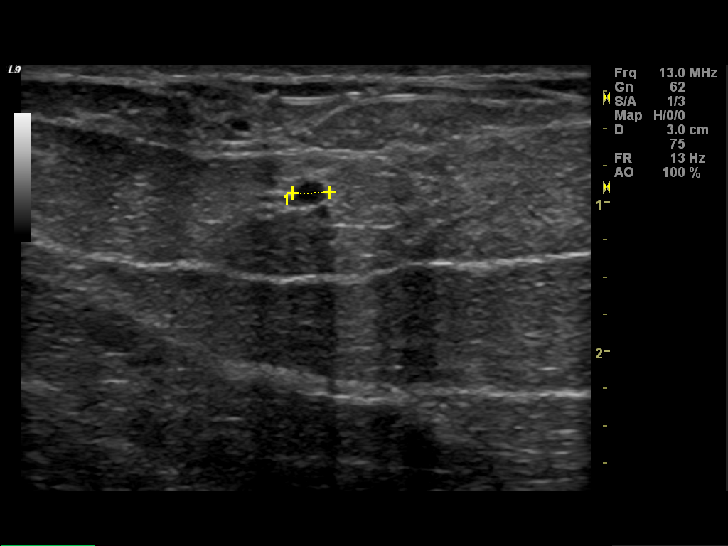
[im 5/10]
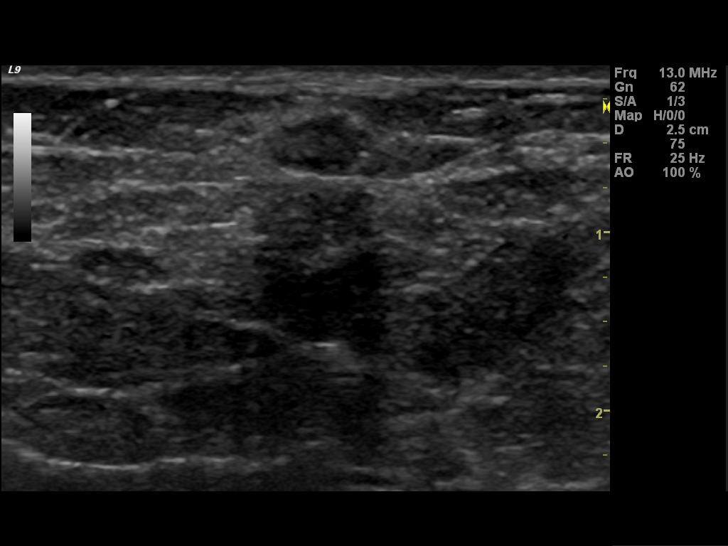
[im 6/10]
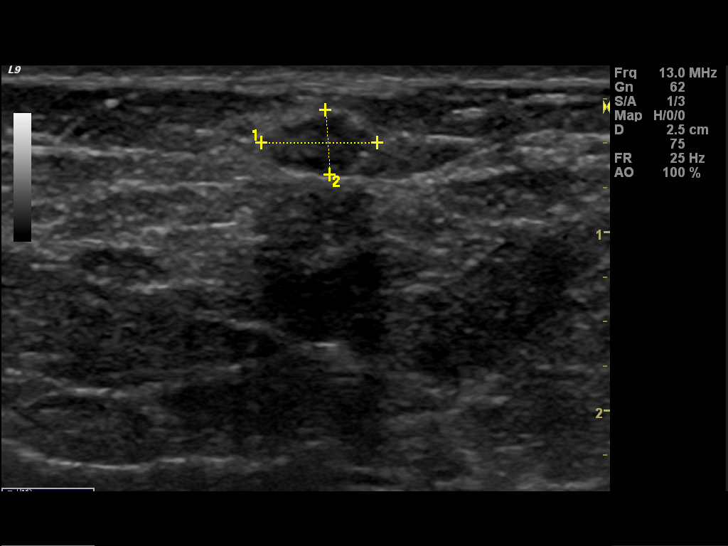
[im 7/10]
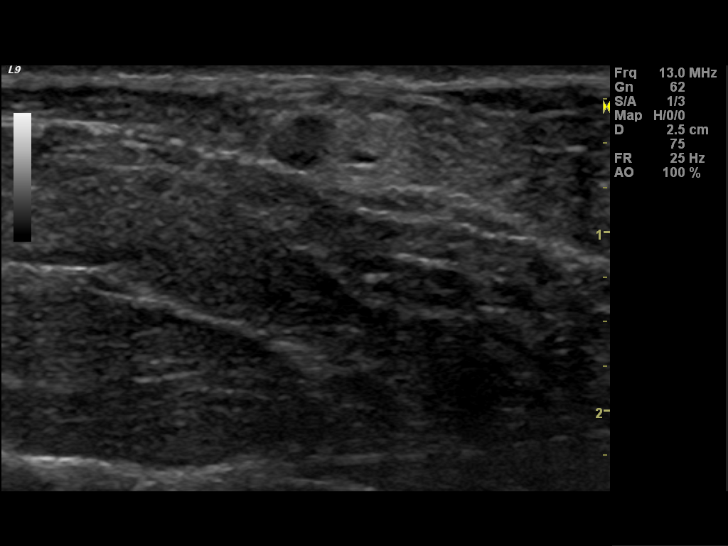
[im 8/10]
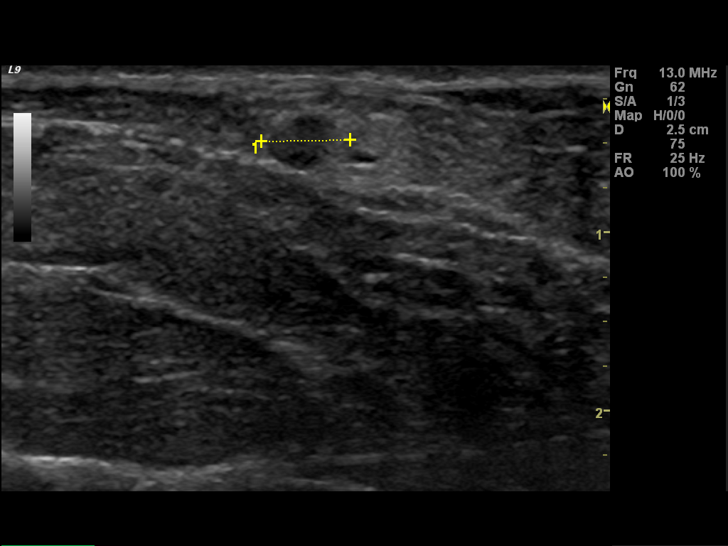
[im 9/10]
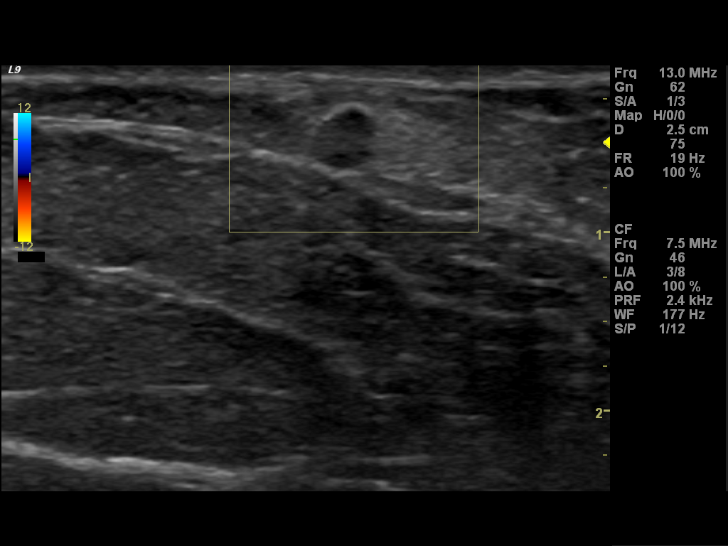
[im 10/10]
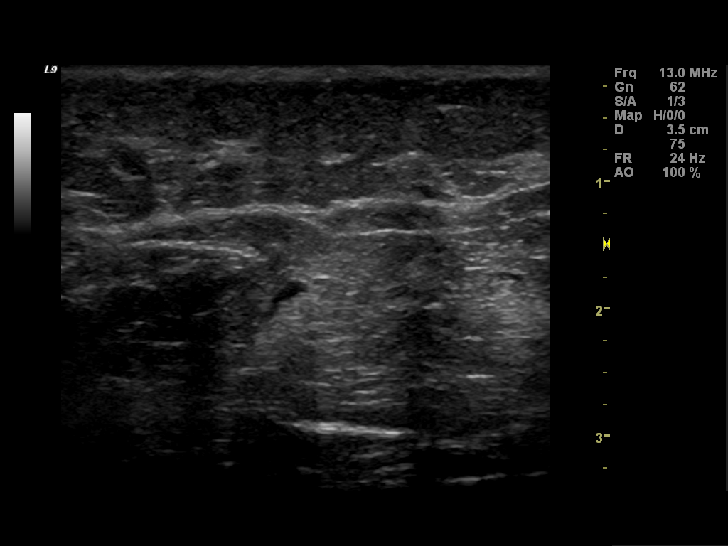

[10 of 10 positions shown; findings below may reference images not displayed]

IMPRESSION: Likely benign focus of fat necrosis with oil cyst formation in the
upper right breast accounting for the area of palpable concern.

RECOMMENDATION:  Right breast ultrasound in 6 months.

BI-RADS CATEGORY: 3: Probably benign.
FINDINGS: Within the 12 o'clock position right breast middle depth there is a
7 mm oval mass with indistinct margins. No concerning mass
identified at the site of palpable abnormality on spot tangential
view. No concerning calcifications architectural distortion
identified within either breast.

Mammographic images were processed with CAD.

On physical exam, I palpate no discrete mass within the 12 o'clock
position right breast anteriorly at the site of reported palpable
abnormality.

Ultrasound is performed, showing an oval circumscribed hypoechoic
mass measuring 4 x 2 x 3 mm within the right breast 12 o'clock
position 3 cm from the nipple at the site of patient reported
palpable abnormality. Additionally there is a 7 x 4 x 5 mm lobular
mass within the right breast 12 o'clock position 8 cm from the
nipple.
IMPRESSION: 1. Slightly suspicious mass 12 o'clock position right breast 8 cm
from the nipple.
2. Probably benign circumscribed oval hypoechoic mass right breast
12 o'clock position 3 cm from the nipple.

RECOMMENDATION:
Ultrasound-guided core needle biopsy suspicious right breast mass 12
o'clock position 8 cm from the nipple.

If pathology is benign, six-month follow-up right breast ultrasound
is recommended to ensure stability of probably benign mass right
breast 12 o'clock position 3 cm from the nipple.

Biopsy scheduled for 07/23/2013 at 9 a.m..

I have discussed the findings and recommendations with the patient.
Results were also provided in writing at the conclusion of the
visit. If applicable, a reminder letter will be sent to the patient
regarding the next appointment.

BI-RADS CATEGORY  4: Suspicious.

## 2020-03-05 ENCOUNTER — Other Ambulatory Visit: Payer: Self-pay

## 2020-03-05 ENCOUNTER — Inpatient Hospital Stay (HOSPITAL_COMMUNITY)
Admission: EM | Admit: 2020-03-05 | Discharge: 2020-03-07 | DRG: 177 | Disposition: A | Discharge status: Home / Self Care | Payer: 59 | Attending: Internal Medicine | Admitting: Internal Medicine

## 2020-03-05 ENCOUNTER — Emergency Department (HOSPITAL_COMMUNITY): Payer: 59

## 2020-03-05 ENCOUNTER — Encounter (HOSPITAL_COMMUNITY): Payer: Self-pay | Admitting: Obstetrics and Gynecology

## 2020-03-05 DIAGNOSIS — R197 Diarrhea, unspecified: Secondary | ICD-10-CM | POA: Diagnosis present

## 2020-03-05 DIAGNOSIS — J1282 Pneumonia due to coronavirus disease 2019: Secondary | ICD-10-CM | POA: Diagnosis present

## 2020-03-05 DIAGNOSIS — K219 Gastro-esophageal reflux disease without esophagitis: Secondary | ICD-10-CM | POA: Diagnosis present

## 2020-03-05 DIAGNOSIS — D72819 Decreased white blood cell count, unspecified: Secondary | ICD-10-CM | POA: Diagnosis present

## 2020-03-05 DIAGNOSIS — E785 Hyperlipidemia, unspecified: Secondary | ICD-10-CM | POA: Diagnosis present

## 2020-03-05 DIAGNOSIS — E669 Obesity, unspecified: Secondary | ICD-10-CM | POA: Diagnosis present

## 2020-03-05 DIAGNOSIS — F32A Depression, unspecified: Secondary | ICD-10-CM | POA: Diagnosis present

## 2020-03-05 DIAGNOSIS — I10 Essential (primary) hypertension: Secondary | ICD-10-CM | POA: Diagnosis present

## 2020-03-05 DIAGNOSIS — D696 Thrombocytopenia, unspecified: Secondary | ICD-10-CM | POA: Diagnosis present

## 2020-03-05 DIAGNOSIS — Z8042 Family history of malignant neoplasm of prostate: Secondary | ICD-10-CM

## 2020-03-05 DIAGNOSIS — U071 COVID-19: Secondary | ICD-10-CM

## 2020-03-05 DIAGNOSIS — E876 Hypokalemia: Secondary | ICD-10-CM | POA: Diagnosis present

## 2020-03-05 DIAGNOSIS — J9601 Acute respiratory failure with hypoxia: Secondary | ICD-10-CM | POA: Diagnosis present

## 2020-03-05 DIAGNOSIS — Z6835 Body mass index (BMI) 35.0-35.9, adult: Secondary | ICD-10-CM

## 2020-03-05 DIAGNOSIS — R112 Nausea with vomiting, unspecified: Secondary | ICD-10-CM | POA: Diagnosis present

## 2020-03-05 DIAGNOSIS — F17210 Nicotine dependence, cigarettes, uncomplicated: Secondary | ICD-10-CM | POA: Diagnosis present

## 2020-03-05 LAB — PROCALCITONIN: Procalcitonin: <0.1 ng/mL

## 2020-03-05 LAB — COMPREHENSIVE METABOLIC PANEL
ALT: 18 U/L (ref 0–44)
AST: 20 U/L (ref 15–41)
Albumin: 3.7 g/dL (ref 3.5–5.0)
Alkaline Phosphatase: 86 U/L (ref 38–126)
Anion gap: 12 (ref 5–15)
BUN: 13 mg/dL (ref 6–20)
CO2: 25 mmol/L (ref 22–32)
Calcium: 8.3 mg/dL — ABNORMAL LOW (ref 8.9–10.3)
Chloride: 103 mmol/L (ref 98–111)
Creatinine, Ser: 0.63 mg/dL (ref 0.44–1.00)
GFR, Estimated: >60 mL/min (ref >60)
Glucose, Bld: 97 mg/dL (ref 70–99)
Potassium: 3.4 mmol/L — ABNORMAL LOW (ref 3.5–5.1)
Sodium: 140 mmol/L (ref 135–145)
Total Bilirubin: 0.4 mg/dL (ref 0.3–1.2)
Total Protein: 6.6 g/dL (ref 6.5–8.1)

## 2020-03-05 LAB — CBC WITH DIFFERENTIAL/PLATELET
Abs Immature Granulocytes: 0.02 10*3/uL (ref 0.00–0.07)
Basophils Absolute: 0 10*3/uL (ref 0.0–0.1)
Basophils Relative: 0 %
Eosinophils Absolute: 0 10*3/uL (ref 0.0–0.5)
Eosinophils Relative: 0 %
HCT: 44.5 % (ref 36.0–46.0)
Hemoglobin: 14.2 g/dL (ref 12.0–15.0)
Immature Granulocytes: 1 %
Lymphocytes Relative: 30 %
Lymphs Abs: 1.1 10*3/uL (ref 0.7–4.0)
MCH: 28.5 pg (ref 26.0–34.0)
MCHC: 31.9 g/dL (ref 30.0–36.0)
MCV: 89.4 fL (ref 80.0–100.0)
Monocytes Absolute: 0.3 10*3/uL (ref 0.1–1.0)
Monocytes Relative: 8 %
Neutro Abs: 2.1 10*3/uL (ref 1.7–7.7)
Neutrophils Relative %: 61 %
Platelets: 124 10*3/uL — ABNORMAL LOW (ref 150–400)
RBC: 4.98 MIL/uL (ref 3.87–5.11)
RDW: 14.9 % (ref 11.5–15.5)
WBC: 3.5 10*3/uL — ABNORMAL LOW (ref 4.0–10.5)
nRBC: 0 % (ref 0.0–0.2)

## 2020-03-05 LAB — FERRITIN: Ferritin: 73 ng/mL (ref 11–307)

## 2020-03-05 LAB — LACTATE DEHYDROGENASE: LDH: 239 U/L — ABNORMAL HIGH (ref 98–192)

## 2020-03-05 LAB — FIBRINOGEN: Fibrinogen: 451 mg/dL (ref 210–475)

## 2020-03-05 LAB — RESP PANEL BY RT-PCR (FLU A&B, COVID) ARPGX2
Influenza A by PCR: NEGATIVE
Influenza B by PCR: NEGATIVE
SARS Coronavirus 2 by RT PCR: POSITIVE — AB

## 2020-03-05 LAB — I-STAT BETA HCG BLOOD, ED (MC, WL, AP ONLY): I-stat hCG, quantitative: <5 m[IU]/mL (ref <5)

## 2020-03-05 LAB — D-DIMER, QUANTITATIVE: D-Dimer, Quant: 0.73 ug/mL-FEU — ABNORMAL HIGH (ref 0.00–0.50)

## 2020-03-05 LAB — C-REACTIVE PROTEIN: CRP: 2.1 mg/dL — ABNORMAL HIGH (ref <1.0)

## 2020-03-05 LAB — TRIGLYCERIDES: Triglycerides: 296 mg/dL — ABNORMAL HIGH (ref <150)

## 2020-03-05 MED ORDER — SODIUM CHLORIDE 0.9 % IV SOLN
100.0000 mg | Freq: Every day | INTRAVENOUS | Status: DC
  Administered 2020-03-06 – 2020-03-07 (×2): 100 mg via INTRAVENOUS
  Filled 2020-03-05 (×2): qty 20

## 2020-03-05 MED ORDER — DEXAMETHASONE SODIUM PHOSPHATE 10 MG/ML IJ SOLN
10.0000 mg | Freq: Once | INTRAMUSCULAR | Status: AC
  Administered 2020-03-05: 10 mg via INTRAVENOUS
  Filled 2020-03-05: qty 1

## 2020-03-05 MED ORDER — SODIUM CHLORIDE 0.9 % IV SOLN
200.0000 mg | Freq: Once | INTRAVENOUS | Status: AC
  Administered 2020-03-05: 17:00:00 200 mg via INTRAVENOUS
  Filled 2020-03-05: qty 40

## 2020-03-05 NOTE — ED Provider Notes (Addendum)
[facility]-EMERGENCY DEPT Provider Note   CSN: [id number] Arrival date & time: 03/05/20  1352     History Chief Complaint  Patient presents with  . Covid Positive    Tina Freeman is a 44 y.o. female.  The history is provided by the patient and medical records. No language interpreter was used.      44 year old female significant history of hypertension, recurrent headache, hyperlipidemia, recently diagnosed positive COVID-19 presenting complaining of not feeling well.  Patient states she developed cough and fever 2 weeks ago.  Was seen by her PCP, had a chest x-ray that shows pneumonia and she was prescribed antibiotic that she took for entire week without any improvement.  She went to a different facility, states she was tested positive for COVID-19 and had another chest x-ray that shows "double pneumonia".  Patient was sent home and was told to monitor her oxygen level with oximeter.  She has noticed O2 sats is usually in the 90-91% on room air.  She continues to endorse having fever, chills, body aches, congestion, loss of smell, dry cough, shortness of breath, nausea, vomiting, diarrhea, and overall not feeling well.  She feels as if she needs to be admitted to get better.  She report her husband also recently test positive for COVID-19.  She is not vaccinated.  She tries taking a warm bath at home which provide some relief.  Past Medical History:  Diagnosis Date  . Anal intraepithelial neoplasia I   . GERD (gastroesophageal reflux disease)   . Heart murmur ASYMPTOMATIC  . Hyperlipidemia   . Hypertension   . Migraines     Patient Active Problem List   Diagnosis Date Noted  . 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 Hemorrhoids 08/03/2011    Past Surgical History:  Procedure Laterality Date  . ANAL FISSURE REPAIR  01/25/2012   Procedure: ANAL FISSURE REPAIR;  Surgeon: Romie Levee, MD;  Location: [facility];  Service:  General;  Laterality: N/A;  high resolution anoscopy with biopsy    . BAND HEMORRHOIDECTOMY  10/07/11   EUA  . CHOLECYSTECTOMY  2005  . DIAGNOSTIC LARYNGOSCOPY  JUNE 2014   BENIGN BX TOUGH AND THROAT  . HIGH RESOLUTION ANOSCOPY N/A 11/01/2012   Procedure: HIGH RESOLUTION ANOSCOPY WITH BIOPSY AND LASER ABLATION;  Surgeon: Romie Levee, MD;  Location: [facility];  Service: General;  Laterality: N/A;  . LAPAROSCOPIC ASSISTED VAGINAL HYSTERECTOMY  08-20-2008     W/ BILATERAL SALPINGO-OOPHORECTOMY AND LYNX PUBOVAGINAL SLING  . TUBAL LIGATION  02-27-2004   LAPAROSCOPIC W/ FILSHIE CLIPS     OB History   No obstetric history on file.     Family History  Problem Relation Age of Onset  . Cancer Mother        ovarian  . Cancer Father        prostate  . Cancer Sister        ovarian    Social History   Tobacco Use  . Smoking status: Current Every Day Smoker    Packs/day: 0.75    Years: 20.00    Pack years: 15.00    Types: Cigarettes  . Smokeless tobacco: Never Used  Substance Use Topics  . Alcohol use: Yes    Comment: rarely  . Drug use: No    Home Medications Prior to Admission medications   Medication Sig Start Date End Date Taking? Authorizing Provider  amLODipine (NORVASC) 5 MG tablet Take 5 mg by  mouth daily. 02/05/20  Yes [provider]  atorvastatin (LIPITOR) 10 MG tablet Take 10 mg by mouth daily. 02/05/20  Yes [provider]  cloNIDine (CATAPRES) 0.1 MG tablet Take 0.1 mg by mouth in the morning and at bedtime. 02/05/20  Yes [provider]  metoprolol succinate (TOPROL-XL) 100 MG 24 hr tablet Take 100 mg by mouth daily. 02/05/20  Yes [provider]  omeprazole (PRILOSEC) 40 MG capsule Take 40 mg by mouth every morning.   Yes [provider]  venlafaxine (EFFEXOR) 75 MG tablet Take 75 mg by mouth daily. 11/11/19  Yes [provider]    Allergies    Blue dyes (parenteral) and Sulfa antibiotics  Review of Systems    Review of Systems  All other systems reviewed and are negative.   Physical Exam Updated Vital Signs BP 121/67   Pulse 75   Temp 100.2 F (37.9 C)   Resp (!) 21   SpO2 96%   Physical Exam Vitals and nursing note reviewed.  Constitutional:      General: She is not in acute distress.    Appearance: She is well-developed and well-nourished.     Comments: Patient is laying in bed, wearing supplemental oxygen, not in any acute respiratory distress.  Able to speak in complete sentence  HENT:     Head: Atraumatic.  Eyes:     Conjunctiva/sclera: Conjunctivae normal.  Cardiovascular:     Rate and Rhythm: Normal rate and regular rhythm.     Pulses: Normal pulses.     Heart sounds: Normal heart sounds.  Pulmonary:     Effort: Pulmonary effort is normal.     Breath sounds: Normal breath sounds. No wheezing, rhonchi or rales.  Abdominal:     Palpations: Abdomen is soft.     Tenderness: There is no abdominal tenderness.  Musculoskeletal:     Cervical back: Neck supple.     Right lower leg: No edema.     Left lower leg: No edema.  Skin:    Findings: No rash.  Neurological:     Mental Status: She is alert and oriented to person, place, and time.  Psychiatric:        Mood and Affect: Mood and affect and mood normal.     ED Results / Procedures / Treatments   Labs (all labs ordered are listed, but only abnormal results are displayed) Labs Reviewed  CBC WITH DIFFERENTIAL/PLATELET - Abnormal; Notable for the following components:      Result Value   WBC 3.5 (*)    Platelets 124 (*)    All other components within normal limits  COMPREHENSIVE METABOLIC PANEL - Abnormal; Notable for the following components:   Potassium 3.4 (*)    Calcium 8.3 (*)    All other components within normal limits  D-DIMER, QUANTITATIVE (NOT AT [facility]) - Abnormal; Notable for the following components:   D-Dimer, Quant 0.73 (*)    All other components within normal limits  LACTATE DEHYDROGENASE -  Abnormal; Notable for the following components:   LDH 239 (*)    All other components within normal limits  TRIGLYCERIDES - Abnormal; Notable for the following components:   Triglycerides 296 (*)    All other components within normal limits  C-REACTIVE PROTEIN - Abnormal; Notable for the following components:   CRP 2.1 (*)    All other components within normal limits  RESP PANEL BY RT-PCR (FLU A&B, COVID) ARPGX2  CULTURE, BLOOD (ROUTINE X 2)  CULTURE, BLOOD (ROUTINE X 2)  PROCALCITONIN  FERRITIN  FIBRINOGEN  I-STAT BETA HCG BLOOD, ED ([facility], [facility], [facility] ONLY)    EKG None  Radiology DG Chest Portable 1 View  Result Date: 03/05/2020 CLINICAL DATA:  COVID short of breath EXAM: PORTABLE CHEST 1 VIEW COMPARISON:  02/25/2020 FINDINGS: Increased left greater than right basilar infiltrates. No pleural effusion. Stable cardiomediastinal silhouette. No pneumothorax IMPRESSION: Increased left greater than right basilar opacities consistent with pneumonia. Electronically Signed   By: Jasmine Pang M.D.   On: 03/05/2020 16:30    Procedures .Critical Care Performed by: Fayrene Helper, PA-C Authorized by: Fayrene Helper, PA-C   Critical care provider statement:    Critical care time (minutes):  35   Critical care was time spent personally by me on the following activities:  Discussions with consultants, evaluation of patient's response to treatment, examination of patient, ordering and performing treatments and interventions, ordering and review of laboratory studies, ordering and review of radiographic studies, pulse oximetry, re-evaluation of patient's condition, obtaining history from patient or surrogate and review of old charts   (including critical care time)  Medications Ordered in ED Medications  remdesivir 200 mg in sodium chloride 0.9% 250 mL IVPB (0 mg Intravenous Stopped 03/05/20 1736)    Followed by  remdesivir 100 mg in sodium chloride 0.9 % 100 mL IVPB (has no administration in time  range)  dexamethasone (DECADRON) injection 10 mg (10 mg Intravenous Given 03/05/20 1640)    ED Course  I have reviewed the triage vital signs and the nursing notes.  Pertinent labs & imaging results that were available during my care of the patient were reviewed by me and considered in my medical decision making (see chart for details).    MDM Rules/Calculators/A&P                          BP 121/67   Pulse 75   Temp 100.2 F (37.9 C)   Resp (!) 21   SpO2 96%   Final Clinical Impression(s) / ED Diagnoses Final diagnoses:  Acute hypoxemic respiratory failure due to COVID-19 [facility])    Rx / DC Orders ED Discharge Orders    None     3:38 PM Patient is unvaccinated for COVID-19, recently test positive for Covid infection a week ago however has had Covid symptoms ongoing for 2 weeks.  She still not feeling well and continues to endorse generalized weakness and shortness of breath.  She is wearing 2 L of oxygen satting at 96%.  We will recheck Covid test, chest x-ray, and check O2 status on ambulation to assess whether she would benefit from hospital admission.  4:14 PM When ambulating in the room, O2 sats dropped down to 86% on room air.  Patient placed back on 2 L of nasal cannula.  Will obtain labs but patient will benefit from hospital admission which includes steroid and remdesivir.  8:26 PM Appreciate consultation from [facility] Dr. Alinda Money who agrees to see and admit pt for further management of her COVID pneumonia.   Tina Freeman was evaluated in Emergency Department on 03/05/2020 for the symptoms described in the history of present illness. She was evaluated in the context of the global COVID-19 pandemic, which necessitated consideration that the patient might be at risk for infection with the SARS-CoV-2 virus that causes COVID-19. Institutional protocols and algorithms that pertain to the evaluation of patients at risk for COVID-19 are in a state of  rapid change based  on information released by regulatory bodies including the CDC and federal and state organizations. These policies and algorithms were followed during the patient's care in the ED.    Fayrene Helperran, Effie Janoski, PA-C 03/05/20 2050    Fayrene Helperran, Saje Gallop, PA-C 03/05/20 Juline Patch2052    Mancel BaleWentz, Elliott, MD 03/06/20 1002

## 2020-03-05 NOTE — ED Notes (Signed)
Pt able to ambulate without assistance. SpO2 dropped to 86% on room air and pt c/o increases SOB. Pt able to recover in bed on 2L [place] to 95% with work of breathing becoming easier

## 2020-03-05 NOTE — ED Triage Notes (Signed)
Patient reports to the ER for COVID Positive since December 7th. Patient reports she was diagnosed at [facility] hospital and told she had double pneumonia but was not admitted and has since gotten worse

## 2020-03-06 DIAGNOSIS — K219 Gastro-esophageal reflux disease without esophagitis: Secondary | ICD-10-CM

## 2020-03-06 DIAGNOSIS — D696 Thrombocytopenia, unspecified: Secondary | ICD-10-CM

## 2020-03-06 DIAGNOSIS — I10 Essential (primary) hypertension: Secondary | ICD-10-CM

## 2020-03-06 DIAGNOSIS — F32A Depression, unspecified: Secondary | ICD-10-CM

## 2020-03-06 DIAGNOSIS — E785 Hyperlipidemia, unspecified: Secondary | ICD-10-CM

## 2020-03-06 DIAGNOSIS — E876 Hypokalemia: Secondary | ICD-10-CM

## 2020-03-06 DIAGNOSIS — D72819 Decreased white blood cell count, unspecified: Secondary | ICD-10-CM

## 2020-03-06 LAB — HIV ANTIBODY (ROUTINE TESTING W REFLEX): HIV Screen 4th Generation wRfx: NONREACTIVE

## 2020-03-06 LAB — COMPREHENSIVE METABOLIC PANEL
ALT: 20 U/L (ref 0–44)
AST: 24 U/L (ref 15–41)
Albumin: 4 g/dL (ref 3.5–5.0)
Alkaline Phosphatase: 92 U/L (ref 38–126)
Anion gap: 15 (ref 5–15)
BUN: 13 mg/dL (ref 6–20)
CO2: 23 mmol/L (ref 22–32)
Calcium: 8.6 mg/dL — ABNORMAL LOW (ref 8.9–10.3)
Chloride: 103 mmol/L (ref 98–111)
Creatinine, Ser: 0.74 mg/dL (ref 0.44–1.00)
GFR, Estimated: >60 mL/min (ref >60)
Glucose, Bld: 147 mg/dL — ABNORMAL HIGH (ref 70–99)
Potassium: 3.5 mmol/L (ref 3.5–5.1)
Sodium: 141 mmol/L (ref 135–145)
Total Bilirubin: 0.4 mg/dL (ref 0.3–1.2)
Total Protein: 7.4 g/dL (ref 6.5–8.1)

## 2020-03-06 LAB — CBC WITH DIFFERENTIAL/PLATELET
Abs Immature Granulocytes: 0.01 10*3/uL (ref 0.00–0.07)
Basophils Absolute: 0 10*3/uL (ref 0.0–0.1)
Basophils Relative: 1 %
Eosinophils Absolute: 0 10*3/uL (ref 0.0–0.5)
Eosinophils Relative: 0 %
HCT: 44.6 % (ref 36.0–46.0)
Hemoglobin: 14.2 g/dL (ref 12.0–15.0)
Immature Granulocytes: 1 %
Lymphocytes Relative: 26 %
Lymphs Abs: 0.6 10*3/uL — ABNORMAL LOW (ref 0.7–4.0)
MCH: 28.7 pg (ref 26.0–34.0)
MCHC: 31.8 g/dL (ref 30.0–36.0)
MCV: 90.1 fL (ref 80.0–100.0)
Monocytes Absolute: 0.1 10*3/uL (ref 0.1–1.0)
Monocytes Relative: 4 %
Neutro Abs: 1.5 10*3/uL — ABNORMAL LOW (ref 1.7–7.7)
Neutrophils Relative %: 68 %
Platelets: 141 10*3/uL — ABNORMAL LOW (ref 150–400)
RBC: 4.95 MIL/uL (ref 3.87–5.11)
RDW: 14.8 % (ref 11.5–15.5)
WBC: 2.2 10*3/uL — ABNORMAL LOW (ref 4.0–10.5)
nRBC: 0 % (ref 0.0–0.2)

## 2020-03-06 MED ORDER — VENLAFAXINE HCL 75 MG PO TABS
75.0000 mg | ORAL_TABLET | Freq: Every day | ORAL | Status: DC
  Administered 2020-03-06 – 2020-03-07 (×2): 75 mg via ORAL
  Filled 2020-03-06 (×3): qty 1

## 2020-03-06 MED ORDER — BISACODYL 5 MG PO TBEC
5.0000 mg | DELAYED_RELEASE_TABLET | Freq: Every day | ORAL | Status: DC | PRN
Start: 2020-03-06 — End: 2020-03-08

## 2020-03-06 MED ORDER — PANTOPRAZOLE SODIUM 40 MG PO TBEC
40.0000 mg | DELAYED_RELEASE_TABLET | Freq: Every day | ORAL | Status: DC
  Administered 2020-03-06 – 2020-03-07 (×3): 40 mg via ORAL
  Filled 2020-03-06 (×2): qty 1

## 2020-03-06 MED ORDER — AMLODIPINE BESYLATE 5 MG PO TABS
5.0000 mg | ORAL_TABLET | Freq: Every day | ORAL | Status: DC
  Administered 2020-03-06 – 2020-03-07 (×2): 5 mg via ORAL
  Filled 2020-03-06 (×2): qty 1

## 2020-03-06 MED ORDER — LOPERAMIDE HCL 2 MG PO CAPS
2.0000 mg | ORAL_CAPSULE | ORAL | Status: DC | PRN
  Administered 2020-03-06: 11:00:00 2 mg via ORAL
  Filled 2020-03-06: qty 1

## 2020-03-06 MED ORDER — ONDANSETRON HCL 4 MG PO TABS: 4.0000 mg | ORAL_TABLET | Freq: Four times a day (QID) | ORAL | Status: DC | PRN

## 2020-03-06 MED ORDER — ACETAMINOPHEN 325 MG PO TABS
650.0000 mg | ORAL_TABLET | Freq: Four times a day (QID) | ORAL | Status: DC | PRN
  Administered 2020-03-06 (×3): 650 mg via ORAL
  Filled 2020-03-06 (×3): qty 2

## 2020-03-06 MED ORDER — ONDANSETRON HCL 4 MG/2ML IJ SOLN: 4.0000 mg | Freq: Four times a day (QID) | INTRAMUSCULAR | Status: DC | PRN

## 2020-03-06 MED ORDER — ORAL CARE MOUTH RINSE
15.0000 mL | Freq: Two times a day (BID) | OROMUCOSAL | Status: DC
  Administered 2020-03-06 – 2020-03-07 (×3): 15 mL via OROMUCOSAL

## 2020-03-06 MED ORDER — ADULT MULTIVITAMIN W/MINERALS CH
1.0000 | ORAL_TABLET | Freq: Every day | ORAL | Status: DC
  Administered 2020-03-06 – 2020-03-07 (×2): 1 via ORAL
  Filled 2020-03-06 (×2): qty 1

## 2020-03-06 MED ORDER — ENOXAPARIN SODIUM 60 MG/0.6ML ~~LOC~~ SOLN
50.0000 mg | SUBCUTANEOUS | Status: DC
  Administered 2020-03-07: 10:00:00 50 mg via SUBCUTANEOUS
  Filled 2020-03-06: qty 0.6

## 2020-03-06 MED ORDER — ENSURE ENLIVE PO LIQD
237.0000 mL | Freq: Two times a day (BID) | ORAL | Status: DC
  Administered 2020-03-07 (×2): 237 mL via ORAL

## 2020-03-06 MED ORDER — METOPROLOL SUCCINATE ER 100 MG PO TB24
100.0000 mg | ORAL_TABLET | Freq: Every day | ORAL | Status: DC
  Administered 2020-03-06 – 2020-03-07 (×2): 100 mg via ORAL
  Filled 2020-03-06 (×2): qty 1

## 2020-03-06 MED ORDER — ENOXAPARIN SODIUM 40 MG/0.4ML ~~LOC~~ SOLN
40.0000 mg | SUBCUTANEOUS | Status: DC
  Administered 2020-03-06: 11:00:00 40 mg via SUBCUTANEOUS
  Filled 2020-03-06: qty 0.4

## 2020-03-06 MED ORDER — SENNOSIDES-DOCUSATE SODIUM 8.6-50 MG PO TABS: 1.0000 | ORAL_TABLET | Freq: Every evening | ORAL | Status: DC | PRN

## 2020-03-06 MED ORDER — DEXAMETHASONE 4 MG PO TABS
6.0000 mg | ORAL_TABLET | ORAL | Status: DC
  Administered 2020-03-06 – 2020-03-07 (×2): 6 mg via ORAL
  Filled 2020-03-06 (×2): qty 2

## 2020-03-06 MED ORDER — ATORVASTATIN CALCIUM 10 MG PO TABS
10.0000 mg | ORAL_TABLET | Freq: Every day | ORAL | Status: DC
  Administered 2020-03-06 – 2020-03-07 (×2): 10 mg via ORAL
  Filled 2020-03-06 (×2): qty 1

## 2020-03-06 MED ORDER — CLONIDINE HCL 0.1 MG PO TABS
0.1000 mg | ORAL_TABLET | Freq: Two times a day (BID) | ORAL | Status: DC
  Administered 2020-03-06 – 2020-03-07 (×4): 0.1 mg via ORAL
  Filled 2020-03-06 (×4): qty 1

## 2020-03-06 NOTE — Progress Notes (Signed)
Initial Nutrition Assessment  DOCUMENTATION CODES:   Obesity unspecified  INTERVENTION:   -Ensure Enlive po BID, each supplement provides 350 kcal and 20 grams of protein  NUTRITION DIAGNOSIS:   Increased nutrient needs related to acute illness (COVID-19 infection) as evidenced by estimated needs.  GOAL:   Patient will meet greater than or equal to 90% of their needs  MONITOR:   PO intake,Supplement acceptance,Labs,Weight trends,I & O's  REASON FOR ASSESSMENT:   Malnutrition Screening Tool    ASSESSMENT:   44 y.o. female with medical history significant of HTN, HLD, gerd, depression, obesity who presents with dyspnea in setting of COVID-19 infection.  Patient started her symptoms around 02/18/2020 and tested positive on 02/25/2020 when she presented to [facility] urgent care for progressively worsening symptoms.  Came to ED with worsening shortness of breath and found to be hypoxic requiring 2 L of oxygen.  COVID-19 + since 12/7.  Patient reporting improving appetite. Pt is having diarrhea after she eats.  Will order Ensure supplements given increased needs from active COVID-19 infection.  Per weight records in care everywhere, pt has lost 10 lbs since 11/30 (4% wt loss x 2 weeks, significant for time frame).  Labs reviewed. Medications: Multivitamin with minerals daily  NUTRITION - FOCUSED PHYSICAL EXAM:  Unable to complete  Diet Order:   Diet Order            Diet 2 gram sodium Room service appropriate? Yes; Fluid consistency: Thin  Diet effective now                 EDUCATION NEEDS:   No education needs have been identified at this time  Skin:  Skin Assessment: Reviewed RN Assessment  Last BM:  12/17  Height:   Ht Readings from Last 1 Encounters:  03/06/20 5\' 8"  (1.727 m)    Weight:   Wt Readings from Last 1 Encounters:  03/06/20 105.7 kg   BMI:  Body mass index is 35.43 kg/m.  Estimated Nutritional Needs:   Kcal:  1850-2050  Protein:   95-110g  Fluid:  2L/day  03/08/20, MS, RD, LDN Inpatient Clinical Dietitian Contact information available via Amion

## 2020-03-06 NOTE — ED Notes (Signed)
Report called to Patty RN.

## 2020-03-06 NOTE — Progress Notes (Signed)
PROGRESS NOTE    Tina Freeman  ZOX:[id number] DOB: 27-Jun-1975 DOA: 03/05/2020 PCP: Hadley Pen, MD   Brief Narrative: Taken from H&P Tina Freeman is a 44 y.o. female with medical history significant of HTN, HLD, gerd, depression, obesity who presents with dyspnea in setting of COVID-19 infection.  Patient started her symptoms around 02/18/2020 and tested positive on 02/25/2020 when she presented to [facility] urgent care for progressively worsening symptoms.  Came to ED with worsening shortness of breath and found to be hypoxic requiring 2 L of oxygen. Patient is unvaccinated.  Started on remdesivir and steroid.  Subjective: Patient was complaining of diarrhea each time she eats.  Shortness of breath seems improving.  Appetite okay.  Assessment & Plan:   Active Problems:   COVID-19   Leukopenia   Hypokalemia   Thrombocytopenia (HCC)   GERD (gastroesophageal reflux disease)   Essential hypertension   Depression   HLD (hyperlipidemia)  Acute hypoxic respiratory failure secondary to COVID-19 pneumonia. Mildly elevated CRP and D-dimer, procalcitonin negative.  Leukopenia with white cell count of 2.2 and neutrophil count of 1.5. -Continue remdesivir and steroid. -Continue with supplemental oxygen to keep saturation above 90%-wean as tolerated. -Incentive spirometry and flutter valve. -Encourage proning. -Continue with supportive care and vitamins.  Thrombocytopenia.  Seems chronic.  No obvious bleeding.  Platelets seems improving today. -Continue to monitor.  Hypertension.  Blood pressure within goal. -Continue home antihypertensives floored amlodipine, metoprolol and clonidine.  Hyperlipidemia. -Continue home statin.  Depression.  No acute concern. -Continue home dose of Effexor  GERD. -Continue Protonix  Objective: Vitals:   03/06/20 0025 03/06/20 0431 03/06/20 0826 03/06/20 1226  BP: 138/80 138/69 120/74 138/77  Pulse: 70 68 67 73  Resp: 18 16 18 18   Temp:  98.9 F (37.2 C) 98.9 F (37.2 C) 98.5 F (36.9 C) 99.1 F (37.3 C)  TempSrc: Oral Oral Oral Oral  SpO2: 95% 94% 93% 94%  Weight: 105.7 kg     Height: 5\' 8"  (1.727 m)       Intake/Output Summary (Last 24 hours) at 03/06/2020 1405 Last data filed at 03/05/2020 1736 Gross per 24 hour  Intake 250 ml  Output --  Net 250 ml   Filed Weights   03/06/20 0025  Weight: 105.7 kg    Examination:  General exam: Appears calm and comfortable  Respiratory system: Clear to auscultation. Respiratory effort normal. Cardiovascular system: S1 & S2 heard, RRR. No JVD, murmurs, rubs, gallops or clicks. Gastrointestinal system: Soft, nontender, nondistended, bowel sounds positive. Central nervous system: Alert and oriented. No focal neurological deficits.Symmetric 5 x 5 power. Extremities: No edema, no cyanosis, pulses intact and symmetrical. Skin: No rashes, lesions or ulcers Psychiatry: Judgement and insight appear normal. Mood & affect appropriate.    DVT prophylaxis: Lovenox Code Status: Full Family Communication: Discussed with patient Disposition Plan:  Status is: Inpatient  Remains inpatient appropriate because:Inpatient level of care appropriate due to severity of illness   Dispo: The patient is from: Home              Anticipated d/c is to: Home              Anticipated d/c date is: 2 days              Patient currently is not medically stable to d/c.  Consultants:   None  Procedures:  Antimicrobials:   Data Reviewed: I have personally reviewed following labs and imaging studies  CBC: Recent Labs  Lab 03/05/20 1500 03/06/20 0049  WBC 3.5* 2.2*  NEUTROABS 2.1 1.5*  HGB 14.2 14.2  HCT 44.5 44.6  MCV 89.4 90.1  PLT 124* 141*   Basic Metabolic Panel: Recent Labs  Lab 03/05/20 1500 03/06/20 0049  NA 140 141  K 3.4* 3.5  CL 103 103  CO2 25 23  GLUCOSE 97 147*  BUN 13 13  CREATININE 0.63 0.74  CALCIUM 8.3* 8.6*   GFR: Estimated Creatinine Clearance:  114.2 mL/min (by C-G formula based on SCr of 0.74 mg/dL). Liver Function Tests: Recent Labs  Lab 03/05/20 1500 03/06/20 0049  AST 20 24  ALT 18 20  ALKPHOS 86 92  BILITOT 0.4 0.4  PROT 6.6 7.4  ALBUMIN 3.7 4.0   No results for input(s): LIPASE, AMYLASE in the last 168 hours. No results for input(s): AMMONIA in the last 168 hours. Coagulation Profile: No results for input(s): INR, PROTIME in the last 168 hours. Cardiac Enzymes: No results for input(s): CKTOTAL, CKMB, CKMBINDEX, TROPONINI in the last 168 hours. BNP (last 3 results) No results for input(s): PROBNP in the last 8760 hours. HbA1C: No results for input(s): HGBA1C in the last 72 hours. CBG: No results for input(s): GLUCAP in the last 168 hours. Lipid Profile: Recent Labs    03/05/20 1500  TRIG 296*   Thyroid Function Tests: No results for input(s): TSH, T4TOTAL, FREET4, T3FREE, THYROIDAB in the last 72 hours. Anemia Panel: Recent Labs    03/05/20 1500  FERRITIN 73   Sepsis Labs: Recent Labs  Lab 03/05/20 1500  PROCALCITON <0.10    Recent Results (from the past 240 hour(s))  Resp Panel by RT-PCR (Flu A&B, Covid) Nasopharyngeal Swab     Status: Abnormal   Collection Time: 03/05/20  3:33 PM   Specimen: Nasopharyngeal Swab; Nasopharyngeal(NP) swabs in vial transport medium  Result Value Ref Range Status   SARS Coronavirus 2 by RT PCR POSITIVE (A) NEGATIVE Final    Comment: RESULT CALLED TO, READ BACK BY AND VERIFIED WITH: BONIS T 12.16.21 @ 2104 BY MECIAL J. (NOTE) SARS-CoV-2 target nucleic acids are DETECTED.  The SARS-CoV-2 RNA is generally detectable in upper respiratory specimens during the acute phase of infection. Positive results are indicative of the presence of the identified virus, but do not rule out bacterial infection or co-infection with other pathogens not detected by the test. Clinical correlation with patient history and other diagnostic information is necessary to determine  patient infection status. The expected result is Negative.  Fact Sheet for Patients: BloggerCourse.com  Fact Sheet for Healthcare Providers: SeriousBroker.it  This test is not yet approved or cleared by the [place] FDA and  has been authorized for detection and/or diagnosis of SARS-CoV-2 by FDA under an Emergency Use Authorization (EUA).  This EUA will remain in effect (meaning this test can  be used) for the duration of  the COVID-19 declaration under Section 564(b)(1) of the Act, 21 U.S.C. section 360bbb-3(b)(1), unless the authorization is terminated or revoked sooner.     Influenza A by PCR NEGATIVE NEGATIVE Final   Influenza B by PCR NEGATIVE NEGATIVE Final    Comment: (NOTE) The Xpert Xpress SARS-CoV-2/FLU/RSV plus assay is intended as an aid in the diagnosis of influenza from Nasopharyngeal swab specimens and should not be used as a sole basis for treatment. Nasal washings and aspirates are unacceptable for Xpert Xpress SARS-CoV-2/FLU/RSV testing.  Fact Sheet for Patients: BloggerCourse.com  Fact Sheet for Healthcare Providers: SeriousBroker.it  This test is not yet  approved or cleared by the [place] and has been authorized for detection and/or diagnosis of SARS-CoV-2 by FDA under an Emergency Use Authorization (EUA). This EUA will remain in effect (meaning this test can be used) for the duration of the COVID-19 declaration under Section 564(b)(1) of the Act, 21 U.S.C. section 360bbb-3(b)(1), unless the authorization is terminated or revoked.  Performed at [facility], [address] [place], [place] [address]   Blood Culture (routine x 2)     Status: None (Preliminary result)   Collection Time: 03/06/20 12:49 AM   Specimen: BLOOD  Result Value Ref Range Status   Specimen Description   Final    BLOOD LEFT ARM Performed at  [facility], [address], [place], [place] [address]    Special Requests   Final    BOTTLES DRAWN AEROBIC AND ANAEROBIC Blood Culture adequate volume Performed at [facility], [address], [place], [place] [address]    Culture   Final    NO GROWTH < 12 HOURS Performed at [facility], [address] [place], [place] [address]    Report Status PENDING  Incomplete  Blood Culture (routine x 2)     Status: None (Preliminary result)   Collection Time: 03/06/20 12:49 AM   Specimen: BLOOD  Result Value Ref Range Status   Specimen Description   Final    BLOOD RIGHT ARM Performed at [facility], [address], [place], [place] [address]    Special Requests   Final    BOTTLES DRAWN AEROBIC ONLY Blood Culture adequate volume Performed at [facility], [address], [place], [place] [address]    Culture   Final    NO GROWTH < 12 HOURS Performed at [facility], [address], [place], [place] [address]    Report Status PENDING  Incomplete     Radiology Studies: DG Chest Portable 1 View  Result Date: 03/05/2020 CLINICAL DATA:  COVID short of breath EXAM: PORTABLE CHEST 1 VIEW COMPARISON:  02/25/2020 FINDINGS: Increased left greater than right basilar infiltrates. No pleural effusion. Stable cardiomediastinal silhouette. No pneumothorax IMPRESSION: Increased left greater than right basilar opacities consistent with pneumonia. Electronically Signed   By: Jasmine Pang M.D.   On: 03/05/2020 16:30    Scheduled Meds: . amLODipine  5 mg Oral Daily  . atorvastatin  10 mg Oral Daily  . cloNIDine  0.1 mg Oral BID  . dexamethasone  6 mg Oral Q24H  . [START ON 03/07/2020] enoxaparin (LOVENOX) injection  50 mg Subcutaneous Q24H  . mouth rinse  15 mL Mouth Rinse BID  . metoprolol succinate  100 mg Oral Daily  . multivitamin with minerals  1 tablet Oral Daily  . pantoprazole  40 mg Oral Daily  .  venlafaxine  75 mg Oral Daily   Continuous Infusions: . remdesivir 100 mg in NS 100 mL 100 mg (03/06/20 1056)     LOS: 1 day   Time spent: 35 minutes  Arnetha Courser, MD [facility]  If 7PM-7AM, please contact night-coverage [email or web address]  03/06/2020, 2:05 PM   This record has been created using Conservation officer, historic buildings. Errors have been sought and corrected,but may not always be located. Such creation errors do not reflect on the standard of care.

## 2020-03-06 NOTE — H&P (Signed)
History and Physical    Tina EwingBrandi Metheny ZOX:[id number] DOB: 1975/10/27 DOA: 03/05/2020  Referring MD/NP/PA: Fayrene HelperBowie Tran PCP: Hadley Penobbins, Robert A, MD  Patient coming from: Home  Chief Complaint: Dyspnea   HPI: Tina Freeman is a 44 y.o. female with medical history significant of HTN, HLD, gerd, depression, obesity who presents with dyspnea in setting of COVID-19 infection.   Patient reports she started feeling poorly 11/30 and was [facility].  She reports not being tested for Covid at that time but she progressively worsened until she was evaluated on 12/7 and diagnosed with COVID-19.  She reports being sent home to convalesce.  Her dyspnea progressively worsened and she decided to be reevaluated.  She reports having a dry cough, significant dyspnea, fevers, diarrhea, headache, nausea.  Denies change in vision, syncope, abdominal pain, swelling. She has not been vaccinated for Covid. Other history includes hypertension and HLD.   ED Course:  Temp 100.2, pulse 98, BP 145/80, RR 16, 95% on room air. She reportedly desatted and required 2 L nasal cannula. Labs significant for WBC 3.5, platelets 124, potassium 3.4, normal creatinine, LDH 239, triglycerides 296, ferritin 70, Procalcitonin less than 0.10.  Covid PCR positive. CXR Increased left greater than right basilar opacities consistent with Pneumonia. ECG- NSR, normal ECG   Review of Systems: As per HPI otherwise 10 point review of systems negative.   Past Medical History:  Diagnosis Date  . Anal intraepithelial neoplasia I   . GERD (gastroesophageal reflux disease)   . Heart murmur ASYMPTOMATIC  . Hyperlipidemia   . Hypertension   . Migraines     Past Surgical History:  Procedure Laterality Date  . ANAL FISSURE REPAIR  01/25/2012   Procedure: ANAL FISSURE REPAIR;  Surgeon: Romie LeveeAlicia Thomas, MD;  [facility];  Service: General;  Laterality: N/A;  high resolution anoscopy with biopsy    .  BAND HEMORRHOIDECTOMY  10/07/11   EUA  . CHOLECYSTECTOMY  2005  . DIAGNOSTIC LARYNGOSCOPY  JUNE 2014   BENIGN BX TOUGH AND THROAT  . HIGH RESOLUTION ANOSCOPY N/A 11/01/2012   Procedure: HIGH RESOLUTION ANOSCOPY WITH BIOPSY AND LASER ABLATION;  Surgeon: Romie LeveeAlicia Thomas, MD;  Location: [facility];  Service: General;  Laterality: N/A;  . LAPAROSCOPIC ASSISTED VAGINAL HYSTERECTOMY  08-20-2008     W/ BILATERAL SALPINGO-OOPHORECTOMY AND LYNX PUBOVAGINAL SLING  . TUBAL LIGATION  02-27-2004   LAPAROSCOPIC W/ FILSHIE CLIPS     reports that she has been smoking cigarettes. She has a 15.00 pack-year smoking history. She has never used smokeless tobacco. She reports current alcohol use. She reports that she does not use drugs.  Allergies  Allergen Reactions  . Blue Dyes (Parenteral) Anaphylaxis    BLUE DYES IN ALL  MEDICATION'S (INCLUDING CAPSULE'S)  . Sulfa Antibiotics Hives and Swelling    Family History  Problem Relation Age of Onset  . Cancer Mother        ovarian  . Cancer Father        prostate  . Cancer Sister        ovarian   Unacceptable: Noncontributory, unremarkable, or negative. Acceptable: Family history reviewed and not pertinent (If you reviewed it)  Prior to Admission medications   Medication Sig Start Date End Date Taking? Authorizing Provider  amLODipine (NORVASC) 5 MG tablet Take 5 mg by mouth daily. 02/05/20  Yes [provider]  atorvastatin (LIPITOR) 10 MG tablet Take 10 mg by mouth daily. 02/05/20  Yes [provider]  cloNIDine (CATAPRES) 0.1 MG tablet Take 0.1 mg by mouth in the morning and at bedtime. 02/05/20  Yes [provider]  metoprolol succinate (TOPROL-XL) 100 MG 24 hr tablet Take 100 mg by mouth daily. 02/05/20  Yes [provider]  omeprazole (PRILOSEC) 40 MG capsule Take 40 mg by mouth every morning.   Yes [provider]  venlafaxine (EFFEXOR) 75 MG tablet Take 75 mg by mouth daily. 11/11/19  Yes [provider]    Physical Exam: Vitals:   03/05/20 2200 03/05/20 2300 03/06/20 0000 03/06/20 0025  BP: 127/64 124/68 119/69 138/80  Pulse: 64 73 71 70  Resp: 20 (!) 22 17 18   Temp:    98.9 F (37.2 C)  TempSrc:    Oral  SpO2: 95% 97% 96% 95%  Weight:    105.7 kg  Height:    5\' 8"  (1.727 m)      Constitutional: NAD, calm, comfortable Vitals:   03/05/20 2200 03/05/20 2300 03/06/20 0000 03/06/20 0025  BP: 127/64 124/68 119/69 138/80  Pulse: 64 73 71 70  Resp: 20 (!) 22 17 18   Temp:    98.9 F (37.2 C)  TempSrc:    Oral  SpO2: 95% 97% 96% 95%  Weight:    105.7 kg  Height:    5\' 8"  (1.727 m)   Eyes: PERRL, lids and conjunctivae normal Neck: normal, supple Respiratory: b/l basilar crackles, no wheezing. Normal respiratory effort. No accessory muscle use.  Cardiovascular: Regular rate and rhythm, no murmurs / rubs / gallops. No extremity edema.  Abdomen: no tenderness, no masses palpated. No hepatosplenomegaly. Bowel sounds positive.  Musculoskeletal: no clubbing / cyanosis. No joint deformity upper and lower extremities. Good ROM Skin: no rashes, lesions, ulcers.  Neurologic: CN 2-12 grossly intact. Moving all extremities  Psychiatric: Normal judgment and insight. Alert and oriented x 3. Normal mood.    Labs on Admission: I have personally reviewed following labs and imaging studies  CBC: Recent Labs  Lab 03/05/20 1500  WBC 3.5*  NEUTROABS 2.1  HGB 14.2  HCT 44.5  MCV 89.4  PLT 124*   Basic Metabolic Panel: Recent Labs  Lab 03/05/20 1500  NA 140  K 3.4*  CL 103  CO2 25  GLUCOSE 97  BUN 13  CREATININE 0.63  CALCIUM 8.3*   GFR: Estimated Creatinine Clearance: 114.2 mL/min (by C-G formula based on SCr of 0.63 mg/dL). Liver Function Tests: Recent Labs  Lab 03/05/20 1500  AST 20  ALT 18  ALKPHOS 86  BILITOT 0.4  PROT 6.6  ALBUMIN 3.7   No results for input(s): LIPASE, AMYLASE in the last 168 hours. No results for input(s): AMMONIA in the last  168 hours. Coagulation Profile: No results for input(s): INR, PROTIME in the last 168 hours. Cardiac Enzymes: No results for input(s): CKTOTAL, CKMB, CKMBINDEX, TROPONINI in the last 168 hours. BNP (last 3 results) No results for input(s): PROBNP in the last 8760 hours. HbA1C: No results for input(s): HGBA1C in the last 72 hours. CBG: No results for input(s): GLUCAP in the last 168 hours. Lipid Profile: Recent Labs    03/05/20 1500  TRIG 296*   Thyroid Function Tests: No results for input(s): TSH, T4TOTAL, FREET4, T3FREE, THYROIDAB in the last 72 hours. Anemia Panel: Recent Labs    03/05/20 1500  FERRITIN 73   Urine analysis: No results found for: COLORURINE, APPEARANCEUR, LABSPEC, PHURINE, GLUCOSEU, HGBUR, BILIRUBINUR, KETONESUR, PROTEINUR, UROBILINOGEN, NITRITE, LEUKOCYTESUR Sepsis Labs: @LABRCNTIP (procalcitonin:4,lacticidven:4) )  Recent Results (from the past 240 hour(s))  Resp Panel by RT-PCR (Flu A&B, Covid) Nasopharyngeal Swab     Status: Abnormal   Collection Time: 03/05/20  3:33 PM   Specimen: Nasopharyngeal Swab; Nasopharyngeal(NP) swabs in vial transport medium  Result Value Ref Range Status   SARS Coronavirus 2 by RT PCR POSITIVE (A) NEGATIVE Final    Comment: RESULT CALLED TO, READ BACK BY AND VERIFIED WITH: BONIS T 12.16.21 @ 2104 BY MECIAL J. (NOTE) SARS-CoV-2 target nucleic acids are DETECTED.  The SARS-CoV-2 RNA is generally detectable in upper respiratory specimens during the acute phase of infection. Positive results are indicative of the presence of the identified virus, but do not rule out bacterial infection or co-infection with other pathogens not detected by the test. Clinical correlation with patient history and other diagnostic information is necessary to determine patient infection status. The expected result is Negative.  Fact Sheet for Patients: BloggerCourse.com  Fact Sheet for Healthcare  Providers: SeriousBroker.it  This test is not yet approved or cleared by the [place] FDA and  has been authorized for detection and/or diagnosis of SARS-CoV-2 by FDA under an Emergency Use Authorization (EUA).  This EUA will remain in effect (meaning this test can  be used) for the duration of  the COVID-19 declaration under Section 564(b)(1) of the Act, 21 U.S.C. section 360bbb-3(b)(1), unless the authorization is terminated or revoked sooner.     Influenza A by PCR NEGATIVE NEGATIVE Final   Influenza B by PCR NEGATIVE NEGATIVE Final    Comment: (NOTE) The Xpert Xpress SARS-CoV-2/FLU/RSV plus assay is intended as an aid in the diagnosis of influenza from Nasopharyngeal swab specimens and should not be used as a sole basis for treatment. Nasal washings and aspirates are unacceptable for Xpert Xpress SARS-CoV-2/FLU/RSV testing.  Fact Sheet for Patients: BloggerCourse.com  Fact Sheet for Healthcare Providers: SeriousBroker.it  This test is not yet approved or cleared by the [place] FDA and has been authorized for detection and/or diagnosis of SARS-CoV-2 by FDA under an Emergency Use Authorization (EUA). This EUA will remain in effect (meaning this test can be used) for the duration of the COVID-19 declaration under Section 564(b)(1) of the Act, 21 U.S.C. section 360bbb-3(b)(1), unless the authorization is terminated or revoked.  Performed at [facility], [address], [place], [place] [address]      Radiological Exams on Admission: DG Chest Portable 1 View  Result Date: 03/05/2020 CLINICAL DATA:  COVID short of breath EXAM: PORTABLE CHEST 1 VIEW COMPARISON:  02/25/2020 FINDINGS: Increased left greater than right basilar infiltrates. No pleural effusion. Stable cardiomediastinal silhouette. No pneumothorax IMPRESSION: Increased left greater than right basilar opacities  consistent with pneumonia. Electronically Signed   By: Jasmine Pang M.D.   On: 03/05/2020 16:30    EKG: Independently reviewed. As above   Assessment/Plan Active Problems:   COVID-19   Leukopenia   Hypokalemia   Thrombocytopenia (HCC)   GERD (gastroesophageal reflux disease)   Essential hypertension   Depression   HLD (hyperlipidemia)  COVID-19 with acute hypoxic respiratory failure Leukopenia  PCR positive for COVID. Chest x-ray with bilateral infiltrates, hypoxia requiring 2L [place]. Leukopenia likely related to Covid. Care Everywhere shows normal WBC late november.  - dexamethasone - Remdesivir - Supplemental oxygen - Antipyretics - Continuous pulse ox  Thrombocytopenia - chronic  - daily CBC  Hypokalemia  - Replete   HTN  Home antihypertensive HLD  Home statin Depression  Home Effexor  GERD  Home  protonix  DVT prophylaxis: lovenox  Code Status: Full code  Disposition Plan: Home once stable on oxygen or has been weaned off  Consults called: None Admission status: inpatient   Severity of Illness: The appropriate patient status for this patient is INPATIENT. Inpatient status is judged to be reasonable and necessary in order to provide the required intensity of service to ensure the patient's safety. The patient's presenting symptoms, physical exam findings, and initial radiographic and laboratory data in the context of their chronic comorbidities is felt to place them at high risk for further clinical deterioration. Furthermore, it is not anticipated that the patient will be medically stable for discharge from the hospital within 2 midnights of admission. The following factors support the patient status of inpatient.   " The patient's presenting symptoms include dyspnea. " The worrisome physical exam findings include hypoxia . " The initial radiographic and laboratory data are worrisome because of b/l infiltrates. " The chronic co-morbidities include HTN obesity  .   * I certify that at the point of admission it is my clinical judgment that the patient will require inpatient hospital care spanning beyond 2 midnights from the point of admission due to high intensity of service, high risk for further deterioration and high frequency of surveillance required.*    Delton See Naira Standiford MD [facility] Pager [phone]  If 7PM-7AM, please contact night-coverage [email or web address] Password [email or web address]  03/06/2020, 1:27 AM

## 2020-03-07 DIAGNOSIS — E876 Hypokalemia: Secondary | ICD-10-CM

## 2020-03-07 DIAGNOSIS — J9601 Acute respiratory failure with hypoxia: Secondary | ICD-10-CM

## 2020-03-07 DIAGNOSIS — K219 Gastro-esophageal reflux disease without esophagitis: Secondary | ICD-10-CM

## 2020-03-07 DIAGNOSIS — I10 Essential (primary) hypertension: Secondary | ICD-10-CM

## 2020-03-07 DIAGNOSIS — E785 Hyperlipidemia, unspecified: Secondary | ICD-10-CM

## 2020-03-07 DIAGNOSIS — U071 COVID-19: Principal | ICD-10-CM

## 2020-03-07 LAB — COMPREHENSIVE METABOLIC PANEL
ALT: 34 U/L (ref 0–44)
AST: 39 U/L (ref 15–41)
Albumin: 3.6 g/dL (ref 3.5–5.0)
Alkaline Phosphatase: 75 U/L (ref 38–126)
Anion gap: 14 (ref 5–15)
BUN: 17 mg/dL (ref 6–20)
CO2: 26 mmol/L (ref 22–32)
Calcium: 8.8 mg/dL — ABNORMAL LOW (ref 8.9–10.3)
Chloride: 103 mmol/L (ref 98–111)
Creatinine, Ser: 0.59 mg/dL (ref 0.44–1.00)
GFR, Estimated: >60 mL/min (ref >60)
Glucose, Bld: 114 mg/dL — ABNORMAL HIGH (ref 70–99)
Potassium: 3.4 mmol/L — ABNORMAL LOW (ref 3.5–5.1)
Sodium: 143 mmol/L (ref 135–145)
Total Bilirubin: 0.4 mg/dL (ref 0.3–1.2)
Total Protein: 6.7 g/dL (ref 6.5–8.1)

## 2020-03-07 LAB — CBC WITH DIFFERENTIAL/PLATELET
Abs Immature Granulocytes: 0.02 10*3/uL (ref 0.00–0.07)
Basophils Absolute: 0 10*3/uL (ref 0.0–0.1)
Basophils Relative: 0 %
Eosinophils Absolute: 0 10*3/uL (ref 0.0–0.5)
Eosinophils Relative: 0 %
HCT: 40.6 % (ref 36.0–46.0)
Hemoglobin: 13.1 g/dL (ref 12.0–15.0)
Immature Granulocytes: 0 %
Lymphocytes Relative: 28 %
Lymphs Abs: 1.3 10*3/uL (ref 0.7–4.0)
MCH: 28.5 pg (ref 26.0–34.0)
MCHC: 32.3 g/dL (ref 30.0–36.0)
MCV: 88.3 fL (ref 80.0–100.0)
Monocytes Absolute: 0.4 10*3/uL (ref 0.1–1.0)
Monocytes Relative: 9 %
Neutro Abs: 3 10*3/uL (ref 1.7–7.7)
Neutrophils Relative %: 63 %
Platelets: 136 10*3/uL — ABNORMAL LOW (ref 150–400)
RBC: 4.6 MIL/uL (ref 3.87–5.11)
RDW: 14.8 % (ref 11.5–15.5)
WBC: 4.7 10*3/uL (ref 4.0–10.5)
nRBC: 0 % (ref 0.0–0.2)

## 2020-03-07 LAB — C-REACTIVE PROTEIN: CRP: 0.8 mg/dL (ref <1.0)

## 2020-03-07 LAB — MAGNESIUM: Magnesium: 2.3 mg/dL (ref 1.7–2.4)

## 2020-03-07 LAB — D-DIMER, QUANTITATIVE: D-Dimer, Quant: 0.63 ug/mL-FEU — ABNORMAL HIGH (ref 0.00–0.50)

## 2020-03-07 MED ORDER — ADULT MULTIVITAMIN W/MINERALS CH: 1.0000 | ORAL_TABLET | Freq: Every day | ORAL | 1 refills | Status: AC

## 2020-03-07 MED ORDER — DM-GUAIFENESIN ER 30-600 MG PO TB12: 1.0000 | ORAL_TABLET | Freq: Two times a day (BID) | ORAL | 0 refills | Status: AC

## 2020-03-07 MED ORDER — VITAMIN C 250 MG PO TABS: 250.0000 mg | ORAL_TABLET | Freq: Every day | ORAL | 1 refills | Status: AC

## 2020-03-07 MED ORDER — ENSURE ENLIVE PO LIQD: 237.0000 mL | Freq: Two times a day (BID) | ORAL | 12 refills | Status: AC

## 2020-03-07 MED ORDER — ZINC 220 (50 ZN) MG PO CAPS: 1.0000 | ORAL_CAPSULE | Freq: Every day | ORAL | 1 refills | Status: AC

## 2020-03-07 MED ORDER — POTASSIUM CHLORIDE CRYS ER 20 MEQ PO TBCR
40.0000 meq | EXTENDED_RELEASE_TABLET | Freq: Once | ORAL | Status: AC
  Administered 2020-03-07: 10:00:00 40 meq via ORAL
  Filled 2020-03-07: qty 2

## 2020-03-07 MED ORDER — DEXAMETHASONE 6 MG PO TABS: 6.0000 mg | ORAL_TABLET | ORAL | 0 refills | Status: AC

## 2020-03-07 MED ORDER — DEXAMETHASONE 6 MG PO TABS: 6.0000 mg | ORAL_TABLET | ORAL | 0 refills | Status: DC

## 2020-03-07 NOTE — Progress Notes (Signed)
Patient scheduled for outpatient Remdesivir infusions at 11am on Monday 12/20 and Tuesday 12/21 at [facility]. Please inform the patient to park at [address] [place], [place], as staff will be escorting the patient through the east entrance of the hospital. Appointments take approximately 45 minutes.    There is a wave flag banner located near the entrance on [address]. Turn into this entrance and immediately turn left or right and park in 1 of the 10 designated Covid Infusion Parking spots. There is a phone number on the sign, please call and let the staff know what spot you are in and we will come out and get you. For questions call [phone].  Thanks.

## 2020-03-07 NOTE — Discharge Summary (Addendum)
Physician Discharge Summary  Tina Freeman WUJ:[id number] DOB: Aug 23, 1975 DOA: 03/05/2020  PCP: Hadley Pen, MD  Admit date: 03/05/2020 Discharge date: 03/07/2020  Admitted From: Home Disposition: Home   Recommendations for Outpatient Follow-up:  1. Follow up with PCP in 1-2 weeks 2. Follow-up at remdesivir outpatient clinic 3. Please obtain BMP/CBC in one week 4. Please follow up on the following pending results:None  Home Health: No Equipment/Devices: None Discharge Condition: Stable CODE STATUS: Full Diet recommendation: Heart Healthy / Carb Modified  Brief/Interim Summary: Tina Pughis a 44 y.o.femalewith medical history significant ofHTN, HLD, gerd, depression, obesity who presents withdyspnea in setting of COVID-19 infection.  Patient started her symptoms around 02/18/2020 and tested positive on 02/25/2020 when she presented to [facility] urgent care for progressively worsening symptoms.  Came to ED with worsening shortness of breath and found to be hypoxic requiring 2 L of oxygen. Patient is unvaccinated.  Started on remdesivir and steroid.  Inflammatory markers were mildly elevated but normalized before discharge.  She completed 3-day of remdesivir while in the hospital.  And wants to go home so outpatient remdesivir treatment for Monday and Tuesday was scheduled.  She will have 1 day Of treatment as outpatient infusion center is closed on Sunday.  Discussed with patient but she wants to proceed with discharge.  We walked the patient and saturation remained 90% and above.  Patient was advised to keep taking her saturation and rest if started dropping with ambulation.  She will follow up with her primary care provider.  She will continue with rest of her home meds.  Discharge Diagnoses:  Active Problems:   Acute hypoxemic respiratory failure due to COVID-19 (HCC)   Leukopenia   Hypokalemia   Thrombocytopenia (HCC)   GERD (gastroesophageal reflux disease)    Essential hypertension   Depression   HLD (hyperlipidemia)   Discharge Instructions  Discharge Instructions    Diet - low sodium heart healthy   Complete by: As directed    Discharge instructions   Complete by: As directed    It was pleasure taking care of you. You are being given steroid for 10 days, please take it as directed with meals in the morning. Please go to [facility] outpatient infusion center as directed at 11:00 on Monday and Tuesday to complete your treatment of remdesivir. Keep yourself well hydrated and if you become short of breath rest and check your pulse ox. Follow-up with your primary care provider in 1 week.   Increase activity slowly   Complete by: As directed      Allergies as of 03/07/2020      Reactions   Blue Dyes (parenteral) Anaphylaxis   BLUE DYES IN ALL  MEDICATION'S (INCLUDING CAPSULE'S)   Sulfa Antibiotics Hives, Swelling      Medication List    TAKE these medications   amLODipine 5 MG tablet Commonly known as: NORVASC Take 5 mg by mouth daily.   atorvastatin 10 MG tablet Commonly known as: LIPITOR Take 10 mg by mouth daily.   cloNIDine 0.1 MG tablet Commonly known as: CATAPRES Take 0.1 mg by mouth in the morning and at bedtime.   dexamethasone 6 MG tablet Commonly known as: DECADRON Take 1 tablet (6 mg total) by mouth daily for 10 days. Start taking on: March 08, 2020   dextromethorphan-guaiFENesin 30-600 MG 12hr tablet Commonly known as: MUCINEX DM Take 1 tablet by mouth 2 (two) times daily.   feeding supplement Liqd Take 237 mLs by mouth 2 (two)  times daily between meals. Start taking on: March 08, 2020   metoprolol succinate 100 MG 24 hr tablet Commonly known as: TOPROL-XL Take 100 mg by mouth daily.   multivitamin with minerals Tabs tablet Take 1 tablet by mouth daily. Start taking on: March 08, 2020   omeprazole 40 MG capsule Commonly known as: PRILOSEC Take 40 mg by mouth every morning.   venlafaxine  75 MG tablet Commonly known as: EFFEXOR Take 75 mg by mouth daily.   vitamin C 250 MG tablet Commonly known as: ASCORBIC ACID Take 1 tablet (250 mg total) by mouth daily.   Zinc 220 (50 Zn) MG Caps Take 1 capsule by mouth daily.       Follow-up Information    Hadley Penobbins, Robert A, MD. Schedule an appointment as soon as possible for a visit.   Specialty: Family Medicine Contact information: [address] [place] [address] [place] [id number] [phone]              Allergies  Allergen Reactions  . Blue Dyes (Parenteral) Anaphylaxis    BLUE DYES IN ALL  MEDICATION'S (INCLUDING CAPSULE'S)  . Sulfa Antibiotics Hives and Swelling    Consultations:  None  Procedures/Studies: DG Chest Portable 1 View  Result Date: 03/05/2020 CLINICAL DATA:  COVID short of breath EXAM: PORTABLE CHEST 1 VIEW COMPARISON:  02/25/2020 FINDINGS: Increased left greater than right basilar infiltrates. No pleural effusion. Stable cardiomediastinal silhouette. No pneumothorax IMPRESSION: Increased left greater than right basilar opacities consistent with pneumonia. Electronically Signed   By: Jasmine PangKim  Fujinaga M.D.   On: 03/05/2020 16:30     Subjective: Patient was seen and examined during morning rounds.  She was feeling better and wants to go home.  Discharge Exam: Vitals:   03/07/20 0534 03/07/20 1417  BP: 119/66 (!) 103/58  Pulse: (!) 57 (!) 58  Resp: (!) 21 16  Temp: 98.2 F (36.8 C) 98.1 F (36.7 C)  SpO2: 95% 95%   Vitals:   03/06/20 1226 03/06/20 2037 03/07/20 0534 03/07/20 1417  BP: 138/77 118/72 119/66 (!) 103/58  Pulse: 73 65 (!) 57 (!) 58  Resp: 18 19 (!) 21 16  Temp: 99.1 F (37.3 C) 99.2 F (37.3 C) 98.2 F (36.8 C) 98.1 F (36.7 C)  TempSrc: Oral Oral Oral Oral  SpO2: 94% 96% 95% 95%  Weight:      Height:        General: Tina Freeman is alert, awake, not in acute distress Cardiovascular: RRR, S1/S2 +, no rubs, no gallops Respiratory: CTA bilaterally, no wheezing, no  rhonchi Abdominal: Soft, NT, ND, bowel sounds + Extremities: no edema, no cyanosis   The results of significant diagnostics from this hospitalization (including imaging, microbiology, ancillary and laboratory) are listed below for reference.    Microbiology: Recent Results (from the past 240 hour(s))  Resp Panel by RT-PCR (Flu A&B, Covid) Nasopharyngeal Swab     Status: Abnormal   Collection Time: 03/05/20  3:33 PM   Specimen: Nasopharyngeal Swab; Nasopharyngeal(NP) swabs in vial transport medium  Result Value Ref Range Status   SARS Coronavirus 2 by RT PCR POSITIVE (A) NEGATIVE Final    Comment: RESULT CALLED TO, READ BACK BY AND VERIFIED WITH: BONIS T 12.16.21 @ 2104 BY MECIAL J. (NOTE) SARS-CoV-2 target nucleic acids are DETECTED.  The SARS-CoV-2 RNA is generally detectable in upper respiratory specimens during the acute phase of infection. Positive results are indicative of the presence of the identified virus, but do not rule out bacterial infection or  co-infection with other pathogens not detected by the test. Clinical correlation with patient history and other diagnostic information is necessary to determine patient infection status. The expected result is Negative.  Fact Sheet for Patients: BloggerCourse.com  Fact Sheet for Healthcare Providers: SeriousBroker.it  This test is not yet approved or cleared by the [place] FDA and  has been authorized for detection and/or diagnosis of SARS-CoV-2 by FDA under an Emergency Use Authorization (EUA).  This EUA will remain in effect (meaning this test can  be used) for the duration of  the COVID-19 declaration under Section 564(b)(1) of the Act, 21 U.S.C. section 360bbb-3(b)(1), unless the authorization is terminated or revoked sooner.     Influenza A by PCR NEGATIVE NEGATIVE Final   Influenza B by PCR NEGATIVE NEGATIVE Final    Comment: (NOTE) The Xpert Xpress  SARS-CoV-2/FLU/RSV plus assay is intended as an aid in the diagnosis of influenza from Nasopharyngeal swab specimens and should not be used as a sole basis for treatment. Nasal washings and aspirates are unacceptable for Xpert Xpress SARS-CoV-2/FLU/RSV testing.  Fact Sheet for Patients: BloggerCourse.com  Fact Sheet for Healthcare Providers: SeriousBroker.it  This test is not yet approved or cleared by the [place] FDA and has been authorized for detection and/or diagnosis of SARS-CoV-2 by FDA under an Emergency Use Authorization (EUA). This EUA will remain in effect (meaning this test can be used) for the duration of the COVID-19 declaration under Section 564(b)(1) of the Act, 21 U.S.C. section 360bbb-3(b)(1), unless the authorization is terminated or revoked.  Performed at [facility], [address], [place], [place] [address]   Blood Culture (routine x 2)     Status: None (Preliminary result)   Collection Time: 03/06/20 12:49 AM   Specimen: BLOOD  Result Value Ref Range Status   Specimen Description   Final    BLOOD LEFT ARM Performed at [facility], [address], [place], [place] [address]    Special Requests   Final    BOTTLES DRAWN AEROBIC AND ANAEROBIC Blood Culture adequate volume Performed at [facility], [address] [place], [place] [address]    Culture   Final    NO GROWTH 1 DAY Performed at [facility], [address] [place], [place] [address]    Report Status PENDING  Incomplete  Blood Culture (routine x 2)     Status: None (Preliminary result)   Collection Time: 03/06/20 12:49 AM   Specimen: BLOOD  Result Value Ref Range Status   Specimen Description   Final    BLOOD RIGHT ARM Performed at [facility], [address], [place], [place] [address]    Special Requests   Final    BOTTLES DRAWN AEROBIC ONLY Blood  Culture adequate volume Performed at [facility], [address], [place], [place] [address]    Culture   Final    NO GROWTH 1 DAY Performed at [facility], [address], [place], [place] [address]    Report Status PENDING  Incomplete     Labs: BNP (last 3 results) No results for input(s): BNP in the last 8760 hours. Basic Metabolic Panel: Recent Labs  Lab 03/05/20 1500 03/06/20 0049 03/07/20 0319 03/07/20 0851  NA 140 141 143  --   K 3.4* 3.5 3.4*  --   CL 103 103 103  --   CO2 25 23 26   --   GLUCOSE 97 147*  114*  --   BUN 13 13 17   --   CREATININE 0.63 0.74 0.59  --   CALCIUM 8.3* 8.6* 8.8*  --   MG  --   --   --  2.3   Liver Function Tests: Recent Labs  Lab 03/05/20 1500 03/06/20 0049 03/07/20 0319  AST 20 24 39  ALT 18 20 34  ALKPHOS 86 92 75  BILITOT 0.4 0.4 0.4  PROT 6.6 7.4 6.7  ALBUMIN 3.7 4.0 3.6   No results for input(s): LIPASE, AMYLASE in the last 168 hours. No results for input(s): AMMONIA in the last 168 hours. CBC: Recent Labs  Lab 03/05/20 1500 03/06/20 0049 03/07/20 0319  WBC 3.5* 2.2* 4.7  NEUTROABS 2.1 1.5* 3.0  HGB 14.2 14.2 13.1  HCT 44.5 44.6 40.6  MCV 89.4 90.1 88.3  PLT 124* 141* 136*   Cardiac Enzymes: No results for input(s): CKTOTAL, CKMB, CKMBINDEX, TROPONINI in the last 168 hours. BNP: Invalid input(s): POCBNP CBG: No results for input(s): GLUCAP in the last 168 hours. D-Dimer Recent Labs    03/05/20 1500 03/07/20 0851  DDIMER 0.73* 0.63*   Hgb A1c No results for input(s): HGBA1C in the last 72 hours. Lipid Profile Recent Labs    03/05/20 1500  TRIG 296*   Thyroid function studies No results for input(s): TSH, T4TOTAL, T3FREE, THYROIDAB in the last 72 hours.  Invalid input(s): FREET3 Anemia work up Recent Labs    03/05/20 1500  FERRITIN 73   Urinalysis No results found for: COLORURINE, APPEARANCEUR, LABSPEC, PHURINE, GLUCOSEU, HGBUR, BILIRUBINUR, KETONESUR, PROTEINUR,  UROBILINOGEN, NITRITE, LEUKOCYTESUR Sepsis Labs Invalid input(s): PROCALCITONIN,  WBC,  LACTICIDVEN Microbiology Recent Results (from the past 240 hour(s))  Resp Panel by RT-PCR (Flu A&B, Covid) Nasopharyngeal Swab     Status: Abnormal   Collection Time: 03/05/20  3:33 PM   Specimen: Nasopharyngeal Swab; Nasopharyngeal(NP) swabs in vial transport medium  Result Value Ref Range Status   SARS Coronavirus 2 by RT PCR POSITIVE (A) NEGATIVE Final    Comment: RESULT CALLED TO, READ BACK BY AND VERIFIED WITH: BONIS T 12.16.21 @ 2104 BY MECIAL J. (NOTE) SARS-CoV-2 target nucleic acids are DETECTED.  The SARS-CoV-2 RNA is generally detectable in upper respiratory specimens during the acute phase of infection. Positive results are indicative of the presence of the identified virus, but do not rule out bacterial infection or co-infection with other pathogens not detected by the test. Clinical correlation with patient history and other diagnostic information is necessary to determine patient infection status. The expected result is Negative.  Fact Sheet for Patients: 2105  Fact Sheet for Healthcare Providers: BloggerCourse.com  This test is not yet approved or cleared by the [place] FDA and  has been authorized for detection and/or diagnosis of SARS-CoV-2 by FDA under an Emergency Use Authorization (EUA).  This EUA will remain in effect (meaning this test can  be used) for the duration of  the COVID-19 declaration under Section 564(b)(1) of the Act, 21 U.S.C. section 360bbb-3(b)(1), unless the authorization is terminated or revoked sooner.     Influenza A by PCR NEGATIVE NEGATIVE Final   Influenza B by PCR NEGATIVE NEGATIVE Final    Comment: (NOTE) The Xpert Xpress SARS-CoV-2/FLU/RSV plus assay is intended as an aid in the diagnosis of influenza from Nasopharyngeal swab specimens and should not be used as a sole basis  for treatment. Nasal washings and aspirates are unacceptable for Xpert Xpress SARS-CoV-2/FLU/RSV testing.  Fact Sheet for Patients: [place]  Fact Sheet for Healthcare Providers: SeriousBroker.it  This test is not yet approved or cleared by the [place] FDA and has been authorized for detection and/or diagnosis of SARS-CoV-2 by FDA under an Emergency Use Authorization (EUA). This EUA will remain in effect (meaning this test can be used) for the duration of the COVID-19 declaration under Section 564(b)(1) of the Act, 21 U.S.C. section 360bbb-3(b)(1), unless the authorization is terminated or revoked.  Performed at [facility], [address], [place], [place] [address]   Blood Culture (routine x 2)     Status: None (Preliminary result)   Collection Time: 03/06/20 12:49 AM   Specimen: BLOOD  Result Value Ref Range Status   Specimen Description   Final    BLOOD LEFT ARM Performed at [facility], [address], [place], [place] [address]    Special Requests   Final    BOTTLES DRAWN AEROBIC AND ANAEROBIC Blood Culture adequate volume Performed at [facility], [address] [place], [place] [address]    Culture   Final    NO GROWTH 1 DAY Performed at [facility], [address], [place], [place] [address]    Report Status PENDING  Incomplete  Blood Culture (routine x 2)     Status: None (Preliminary result)   Collection Time: 03/06/20 12:49 AM   Specimen: BLOOD  Result Value Ref Range Status   Specimen Description   Final    BLOOD RIGHT ARM Performed at [facility], [address], [place], [place] [address]    Special Requests   Final    BOTTLES DRAWN AEROBIC ONLY Blood Culture adequate volume Performed at [facility], [address], [place], [place] [address]    Culture   Final    NO GROWTH 1  DAY Performed at [facility], [address] [place], [place] [address]    Report Status PENDING  Incomplete    Time coordinating discharge: Over 30 minutes  SIGNED:  Arnetha Courser, MD  [facility] 03/07/2020, 3:41 PM  If 7PM-7AM, please contact night-coverage [email or web address]  This record has been created using Conservation officer, historic buildings. Errors have been sought and corrected,but may not always be located. Such creation errors do not reflect on the standard of care.

## 2020-03-07 NOTE — Discharge Instructions (Signed)
You are scheduled for an outpatient Remdesivir infusion at 11am on Monday 12/20 & Tuesday 12/21 at [facility]. Please park at [address] [place], [place], as staff will be escorting you through the east entrance of the hospital. Appointments take approximately 45 minutes.    The address for the infusion clinic site is:  --GPS address is [address] - the parking is located near [employer] building where you will see  COVID19 Infusion feather banner marking the entrance to parking.   (see photos below)            --Enter into the 2nd entrance where the "wave, flag banner" is at the road. Turn into this 2nd entrance and immediately turn left to park in 1 of the 5 parking spots.   --Please stay in your car and call the desk for assistance inside [phone].   The day of your visit you should: Marland Kitchen Get plenty of rest the night before and drink plenty of water . Eat a light meal/snack before coming and take your medications as prescribed  . Wear warm, comfortable clothes with a shirt that can roll-up over the elbow (will need IV start).  . Wear a mask  . Consider bringing some activity to help pass the time

## 2020-03-07 NOTE — Progress Notes (Signed)
Patient has received Discharge instructions and IV has been discontinued.  Telemetry monitor has also been discontinued.  Patient is waiting on ride to be transported home to private residence.

## 2020-03-07 NOTE — Progress Notes (Signed)
Patient on Room Air sitting up in bed.  91-93%.  Upon ambulation patient's Oxygen sats were 90% on Room Air.  No SOB noted.  Patient continues to have dry, non-productive cough.  Will continue to monitor.

## 2020-03-09 ENCOUNTER — Ambulatory Visit (HOSPITAL_COMMUNITY)
Admit: 2020-03-09 | Discharge: 2020-03-09 | Disposition: A | Discharge status: Home / Self Care | Payer: 59 | Attending: Pulmonary Disease | Admitting: Pulmonary Disease

## 2020-03-09 DIAGNOSIS — U071 COVID-19: Secondary | ICD-10-CM | POA: Diagnosis present

## 2020-03-09 MED ORDER — ALBUTEROL SULFATE HFA 108 (90 BASE) MCG/ACT IN AERS: 2.0000 | INHALATION_SPRAY | Freq: Once | RESPIRATORY_TRACT | Status: DC | PRN

## 2020-03-09 MED ORDER — SODIUM CHLORIDE 0.9 % IV SOLN: INTRAVENOUS | Status: DC | PRN

## 2020-03-09 MED ORDER — FAMOTIDINE IN NACL 20-0.9 MG/50ML-% IV SOLN: 20.0000 mg | Freq: Once | INTRAVENOUS | Status: DC | PRN

## 2020-03-09 MED ORDER — EPINEPHRINE 0.3 MG/0.3ML IJ SOAJ: 0.3000 mg | Freq: Once | INTRAMUSCULAR | Status: DC | PRN

## 2020-03-09 MED ORDER — METHYLPREDNISOLONE SODIUM SUCC 125 MG IJ SOLR: 125.0000 mg | Freq: Once | INTRAMUSCULAR | Status: DC | PRN

## 2020-03-09 MED ORDER — SODIUM CHLORIDE 0.9 % IV SOLN
100.0000 mg | Freq: Once | INTRAVENOUS | Status: AC
  Administered 2020-03-09: 11:00:00 100 mg via INTRAVENOUS

## 2020-03-09 MED ORDER — DIPHENHYDRAMINE HCL 50 MG/ML IJ SOLN: 50.0000 mg | Freq: Once | INTRAMUSCULAR | Status: DC | PRN

## 2020-03-09 NOTE — Progress Notes (Signed)
  Diagnosis: COVID-19  Physician: Dr. Shan Levans  Procedure: Covid Infusion Clinic Med: remdesivir infusion - Provided patient with remdesivir fact sheet for patients, parents and caregivers prior to infusion.  Complications: No immediate complications noted.  Discharge: Discharged home   Tina Freeman 03/09/2020

## 2020-03-09 NOTE — Discharge Instructions (Signed)
10 Things You Can Do to Manage Your COVID-19 Symptoms at Home If you have possible or confirmed COVID-19: 1. Stay home from work and school. And stay away from other public places. If you must go out, avoid using any kind of public transportation, ridesharing, or taxis. 2. Monitor your symptoms carefully. If your symptoms get worse, call your healthcare provider immediately. 3. Get rest and stay hydrated. 4. If you have a medical appointment, call the healthcare provider ahead of time and tell them that you have or may have COVID-19. 5. For medical emergencies, call 911 and notify the dispatch personnel that you have or may have COVID-19. 6. Cover your cough and sneezes with a tissue or use the inside of your elbow. 7. Wash your hands often with soap and water for at least 20 seconds or clean your hands with an alcohol-based hand sanitizer that contains at least 60% alcohol. 8. As much as possible, stay in a specific room and away from other people in your home. Also, you should use a separate bathroom, if available. If you need to be around other people in or outside of the home, wear a mask. 9. Avoid sharing personal items with other people in your household, like dishes, towels, and bedding. 10. Clean all surfaces that are touched often, like counters, tabletops, and doorknobs. Use household cleaning sprays or wipes according to the label instructions. cdc.gov/coronavirus 09/19/2018 This information is not intended to replace advice given to you by your health care provider. Make sure you discuss any questions you have with your health care provider. Document Revised: 02/21/2019 Document Reviewed: 02/21/2019 Elsevier Patient Education  2020 Elsevier Inc.  

## 2020-03-10 ENCOUNTER — Ambulatory Visit (HOSPITAL_COMMUNITY)
Admit: 2020-03-10 | Discharge: 2020-03-10 | Disposition: A | Discharge status: Home / Self Care | Payer: 59 | Attending: Pulmonary Disease | Admitting: Pulmonary Disease

## 2020-03-10 DIAGNOSIS — U071 COVID-19: Secondary | ICD-10-CM | POA: Diagnosis not present

## 2020-03-10 MED ORDER — SODIUM CHLORIDE 0.9 % IV SOLN: INTRAVENOUS | Status: DC | PRN

## 2020-03-10 MED ORDER — FAMOTIDINE IN NACL 20-0.9 MG/50ML-% IV SOLN: 20.0000 mg | Freq: Once | INTRAVENOUS | Status: DC | PRN

## 2020-03-10 MED ORDER — SODIUM CHLORIDE 0.9 % IV SOLN
100.0000 mg | Freq: Once | INTRAVENOUS | Status: AC
  Administered 2020-03-10: 11:00:00 100 mg via INTRAVENOUS
  Filled 2020-03-10: qty 20

## 2020-03-10 MED ORDER — METHYLPREDNISOLONE SODIUM SUCC 125 MG IJ SOLR: 125.0000 mg | Freq: Once | INTRAMUSCULAR | Status: DC | PRN

## 2020-03-10 MED ORDER — EPINEPHRINE 0.3 MG/0.3ML IJ SOAJ: 0.3000 mg | Freq: Once | INTRAMUSCULAR | Status: DC | PRN

## 2020-03-10 MED ORDER — DIPHENHYDRAMINE HCL 50 MG/ML IJ SOLN: 50.0000 mg | Freq: Once | INTRAMUSCULAR | Status: DC | PRN

## 2020-03-10 MED ORDER — ALBUTEROL SULFATE HFA 108 (90 BASE) MCG/ACT IN AERS: 2.0000 | INHALATION_SPRAY | Freq: Once | RESPIRATORY_TRACT | Status: DC | PRN

## 2020-03-10 NOTE — Discharge Instructions (Signed)
10 Things You Can Do to Manage Your COVID-19 Symptoms at Home If you have possible or confirmed COVID-19: 1. Stay home from work and school. And stay away from other public places. If you must go out, avoid using any kind of public transportation, ridesharing, or taxis. 2. Monitor your symptoms carefully. If your symptoms get worse, call your healthcare provider immediately. 3. Get rest and stay hydrated. 4. If you have a medical appointment, call the healthcare provider ahead of time and tell them that you have or may have COVID-19. 5. For medical emergencies, call 911 and notify the dispatch personnel that you have or may have COVID-19. 6. Cover your cough and sneezes with a tissue or use the inside of your elbow. 7. Wash your hands often with soap and water for at least 20 seconds or clean your hands with an alcohol-based hand sanitizer that contains at least 60% alcohol. 8. As much as possible, stay in a specific room and away from other people in your home. Also, you should use a separate bathroom, if available. If you need to be around other people in or outside of the home, wear a mask. 9. Avoid sharing personal items with other people in your household, like dishes, towels, and bedding. 10. Clean all surfaces that are touched often, like counters, tabletops, and doorknobs. Use household cleaning sprays or wipes according to the label instructions. cdc.gov/coronavirus 09/19/2018 This information is not intended to replace advice given to you by your health care provider. Make sure you discuss any questions you have with your health care provider. Document Revised: 02/21/2019 Document Reviewed: 02/21/2019 Elsevier Patient Education  2020 Elsevier Inc. What types of side effects do monoclonal antibody drugs cause?  Common side effects  In general, the more common side effects caused by monoclonal antibody drugs include: . Allergic reactions, such as hives or itching . Flu-like signs and  symptoms, including chills, fatigue, fever, and muscle aches and pains . Nausea, vomiting . Diarrhea . Skin rashes . Low blood pressure   The CDC is recommending patients who receive monoclonal antibody treatments wait at least 90 days before being vaccinated.  Currently, there are no data on the safety and efficacy of mRNA COVID-19 vaccines in persons who received monoclonal antibodies or convalescent plasma as part of COVID-19 treatment. Based on the estimated half-life of such therapies as well as evidence suggesting that reinfection is uncommon in the 90 days after initial infection, vaccination should be deferred for at least 90 days, as a precautionary measure until additional information becomes available, to avoid interference of the antibody treatment with vaccine-induced immune responses. If you have any questions or concerns after the infusion please call the Advanced Practice Provider on call at [phone]. This number is ONLY intended for your use regarding questions or concerns about the infusion post-treatment side-effects.  Please do not provide this number to others for use. For return to work notes please contact your primary care provider.   If someone you know is interested in receiving treatment please have them call the COVID hotline at [phone].   

## 2020-03-10 NOTE — Progress Notes (Signed)
Patient reviewed Fact Sheet for Patients, Parents, and Caregivers for Emergency Use Authorization (EUA) of Remdesivir for the Treatment of Coronavirus. Patient also reviewed and is agreeable to the estimated cost of treatment. Patient is agreeable to proceed.   

## 2020-03-10 NOTE — Progress Notes (Signed)
°  Diagnosis: COVID-19 ° °Physician:  Patrick Wright ° °Procedure: Covid Infusion Clinic Med: remdesivir infusion - Provided patient with remdesivir fact sheet for patients, parents and caregivers prior to infusion. ° °Complications: No immediate complications noted. ° °Discharge: Discharged home  ° °Krissa Utke L °03/10/2020 ° °

## 2020-03-11 LAB — CULTURE, BLOOD (ROUTINE X 2)
Culture: NO GROWTH
Culture: NO GROWTH
Special Requests: ADEQUATE
Special Requests: ADEQUATE

## 2021-04-20 IMAGING — DX DG CHEST 1V PORT
1 series · 1 of 1 positions shown · non-contrast
Comparison: 02/25/2020

CLINICAL DATA: COVID short of breath

EXAM:
PORTABLE CHEST 1 VIEW

[chest ap]
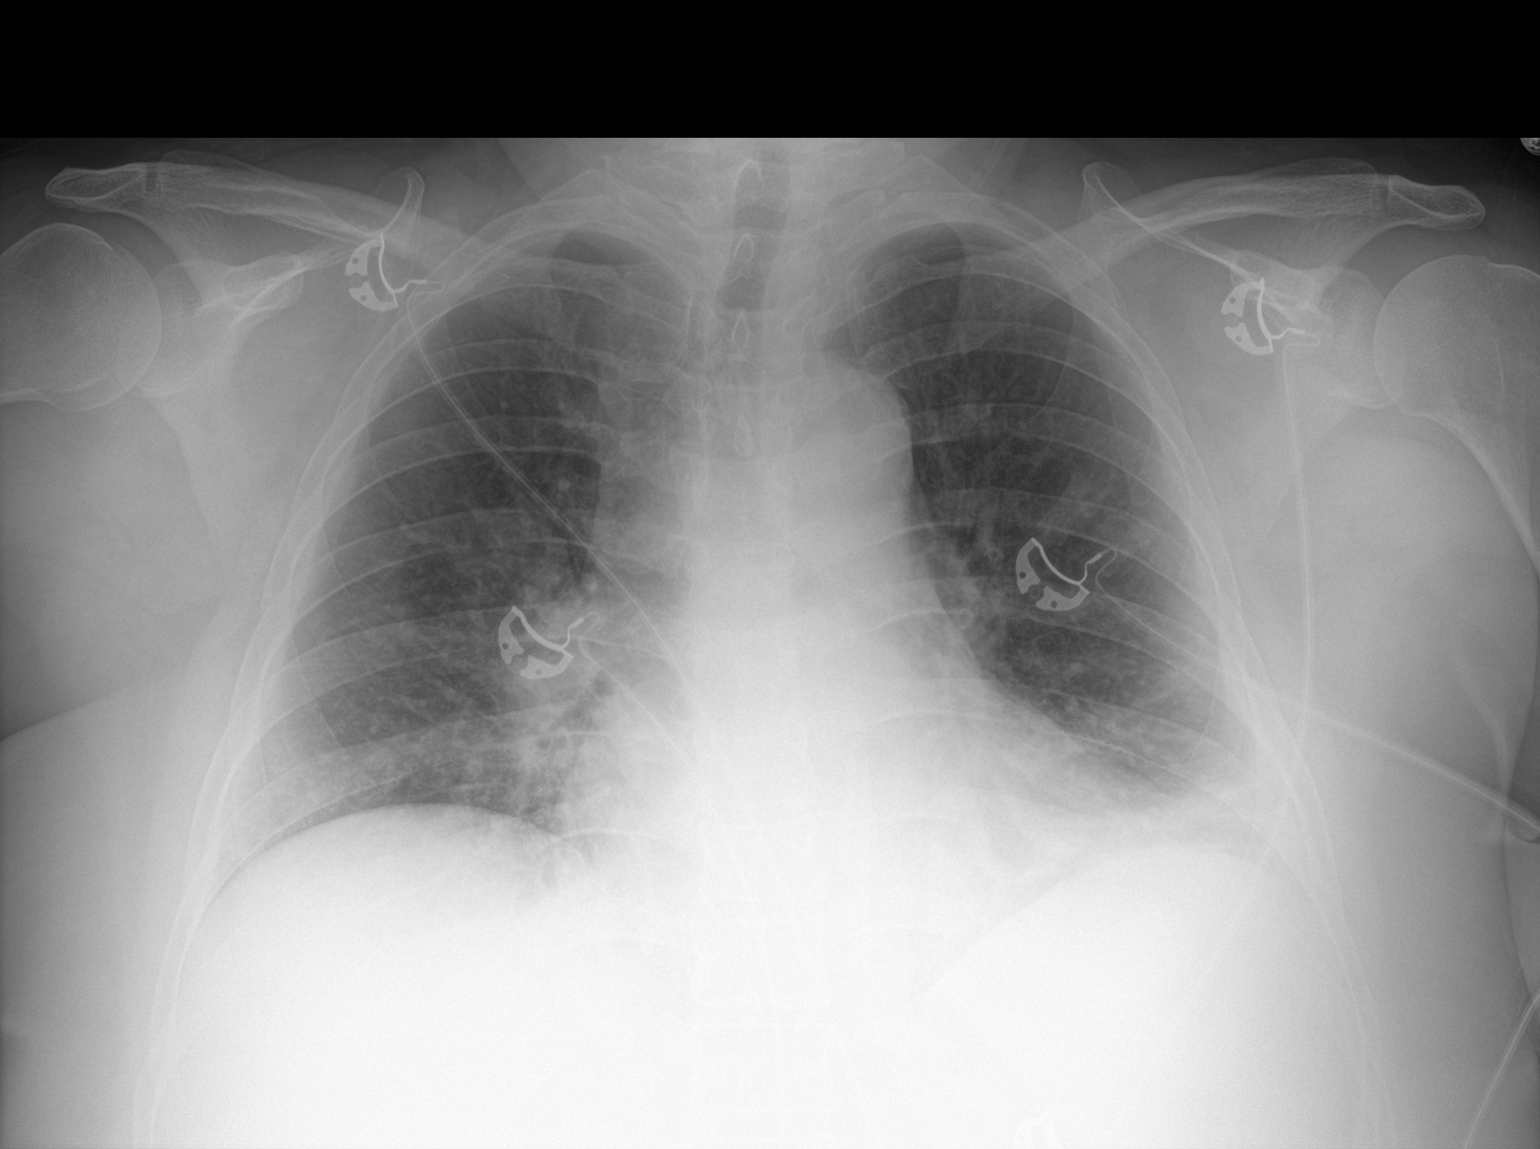

[1 of 1 positions shown; findings below may reference images not displayed]

FINDINGS: Increased left greater than right basilar infiltrates. No pleural
effusion. Stable cardiomediastinal silhouette. No pneumothorax
IMPRESSION: Increased left greater than right basilar opacities consistent with
pneumonia.

## 2021-09-07 ENCOUNTER — Other Ambulatory Visit: Payer: Self-pay | Admitting: Obstetrics and Gynecology

## 2021-09-07 DIAGNOSIS — E049 Nontoxic goiter, unspecified: Secondary | ICD-10-CM

## 2021-09-16 ENCOUNTER — Ambulatory Visit
Admission: RE | Admit: 2021-09-16 | Discharge: 2021-09-16 | Disposition: A | Discharge status: Home / Self Care | Payer: 59 | Source: Ambulatory Visit | Attending: Obstetrics and Gynecology | Admitting: Obstetrics and Gynecology

## 2021-09-16 ENCOUNTER — Other Ambulatory Visit: Payer: 59

## 2021-09-16 DIAGNOSIS — E049 Nontoxic goiter, unspecified: Secondary | ICD-10-CM

## 2021-10-27 ENCOUNTER — Other Ambulatory Visit: Payer: Self-pay | Admitting: Surgery

## 2021-10-27 DIAGNOSIS — E041 Nontoxic single thyroid nodule: Secondary | ICD-10-CM

## 2021-11-04 ENCOUNTER — Other Ambulatory Visit (HOSPITAL_COMMUNITY)
Admission: RE | Admit: 2021-11-04 | Discharge: 2021-11-04 | Disposition: A | Discharge status: Home / Self Care | Payer: 59 | Source: Ambulatory Visit | Attending: Interventional Radiology | Admitting: Interventional Radiology

## 2021-11-04 ENCOUNTER — Ambulatory Visit
Admission: RE | Admit: 2021-11-04 | Discharge: 2021-11-04 | Disposition: A | Discharge status: Home / Self Care | Payer: 59 | Source: Ambulatory Visit | Attending: Surgery | Admitting: Surgery

## 2021-11-04 DIAGNOSIS — E041 Nontoxic single thyroid nodule: Secondary | ICD-10-CM | POA: Insufficient documentation

## 2021-11-05 LAB — CYTOLOGY - NON PAP

## 2021-11-09 NOTE — Progress Notes (Signed)
Biopsy result for this nodule is benign.  Good news.  Will plan repeat USN and TSH in one year, followed by physical exam in my office.  Tresa Endo - please arrange.  tmg  Darnell Level, MD [facility] practice Office: [phone]

## 2021-12-14 ENCOUNTER — Encounter: Payer: Self-pay | Admitting: Obstetrics and Gynecology

## 2022-11-02 ENCOUNTER — Other Ambulatory Visit: Payer: Self-pay | Admitting: Surgery

## 2022-11-02 DIAGNOSIS — E042 Nontoxic multinodular goiter: Secondary | ICD-10-CM

## 2022-11-02 DIAGNOSIS — E049 Nontoxic goiter, unspecified: Secondary | ICD-10-CM
